# Patient Record
Sex: Female | Born: 1985 | Race: Black or African American | Hispanic: No | Marital: Single | Smoking: Never smoker
Health system: Southern US, Community
[De-identification: ages and names within clinical notes are randomized; demographics above are authoritative.]

## PROBLEM LIST (undated history)

## (undated) DIAGNOSIS — N870 Mild cervical dysplasia: Secondary | ICD-10-CM

## (undated) DIAGNOSIS — R634 Abnormal weight loss: Secondary | ICD-10-CM

## (undated) DIAGNOSIS — R87629 Unspecified abnormal cytological findings in specimens from vagina: Secondary | ICD-10-CM

## (undated) DIAGNOSIS — N762 Acute vulvitis: Secondary | ICD-10-CM

## (undated) DIAGNOSIS — R519 Headache, unspecified: Secondary | ICD-10-CM

## (undated) DIAGNOSIS — D649 Anemia, unspecified: Secondary | ICD-10-CM

## (undated) DIAGNOSIS — Z87440 Personal history of urinary (tract) infections: Secondary | ICD-10-CM

## (undated) DIAGNOSIS — R51 Headache: Secondary | ICD-10-CM

## (undated) DIAGNOSIS — Z8619 Personal history of other infectious and parasitic diseases: Secondary | ICD-10-CM

## (undated) DIAGNOSIS — K219 Gastro-esophageal reflux disease without esophagitis: Secondary | ICD-10-CM

## (undated) HISTORY — DX: Acute vulvitis: N76.2

## (undated) HISTORY — PX: WISDOM TOOTH EXTRACTION: SHX21

## (undated) HISTORY — DX: Personal history of other infectious and parasitic diseases: Z86.19

## (undated) HISTORY — DX: Personal history of urinary (tract) infections: Z87.440

## (undated) HISTORY — DX: Mild cervical dysplasia: N87.0

## (undated) HISTORY — DX: Abnormal weight loss: R63.4

---
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
## 2008-02-16 DIAGNOSIS — N762 Acute vulvitis: Secondary | ICD-10-CM

## 2008-02-16 HISTORY — DX: Acute vulvitis: N76.2

## 2010-09-05 DIAGNOSIS — R634 Abnormal weight loss: Secondary | ICD-10-CM

## 2010-09-05 HISTORY — DX: Abnormal weight loss: R63.4

## 2010-10-25 DIAGNOSIS — Z87440 Personal history of urinary (tract) infections: Secondary | ICD-10-CM

## 2010-10-25 DIAGNOSIS — N870 Mild cervical dysplasia: Secondary | ICD-10-CM

## 2010-10-25 HISTORY — DX: Mild cervical dysplasia: N87.0

## 2010-10-25 HISTORY — DX: Personal history of urinary (tract) infections: Z87.440

## 2011-06-06 ENCOUNTER — Emergency Department (HOSPITAL_COMMUNITY)
Admission: EM | Admit: 2011-06-06 | Discharge: 2011-06-06 | Disposition: A | Discharge status: Home / Self Care | Payer: BC Managed Care – PPO | Attending: Emergency Medicine | Admitting: Emergency Medicine

## 2011-06-06 ENCOUNTER — Encounter (HOSPITAL_COMMUNITY): Payer: Self-pay

## 2011-06-06 ENCOUNTER — Emergency Department (HOSPITAL_COMMUNITY): Payer: BC Managed Care – PPO

## 2011-06-06 DIAGNOSIS — R531 Weakness: Secondary | ICD-10-CM

## 2011-06-06 DIAGNOSIS — R5383 Other fatigue: Secondary | ICD-10-CM | POA: Insufficient documentation

## 2011-06-06 DIAGNOSIS — Z79899 Other long term (current) drug therapy: Secondary | ICD-10-CM | POA: Insufficient documentation

## 2011-06-06 DIAGNOSIS — R209 Unspecified disturbances of skin sensation: Secondary | ICD-10-CM | POA: Insufficient documentation

## 2011-06-06 DIAGNOSIS — R5381 Other malaise: Secondary | ICD-10-CM | POA: Insufficient documentation

## 2011-06-06 MED ORDER — LORAZEPAM 1 MG PO TABS
1.0000 mg | ORAL_TABLET | Freq: Once | ORAL | Status: AC
  Administered 2011-06-06: 1 mg via ORAL
  Filled 2011-06-06: qty 1

## 2011-06-06 NOTE — ED Notes (Signed)
Sudden onset right arm numbness and tingling at 0800hrs

## 2011-06-06 NOTE — ED Notes (Signed)
C/O numbness in the right arm and hand since 0800hrs today, states has a tingling sensation in right arm and hand when the area is touched, able to differentiate between sharp and dull touch equally in both arms, moves all extremities without difficulty, right hand grip weaker than left

## 2011-06-06 NOTE — ED Notes (Signed)
Family at bedside. 

## 2011-06-06 NOTE — ED Notes (Signed)
Patient denies pain and is resting comfortably.  

## 2011-06-06 NOTE — ED Notes (Signed)
Patient is resting comfortably. 

## 2011-06-06 NOTE — ED Notes (Signed)
Patient transported to MRI via stretcher with MRI tech. ?

## 2011-06-06 NOTE — ED Notes (Signed)
Pt given discharge instructions, verb understanding and amb indep to discharge windwo

## 2011-06-06 NOTE — ED Provider Notes (Signed)
History     CSN: [id number]  Arrival date & time 06/06/11  1218   First MD Initiated Contact with Patient 06/06/11 1239      Chief Complaint  Patient presents with  . Numbness    right arm    (Consider location/radiation/quality/duration/timing/severity/associated sxs/prior treatment) HPI Comments: Patient presents to the emergency department with a chief complaint of right-sided weakness and paresthesias.  Patient states her symptoms began at 8 o'clock this morning and denies having symptoms like this before in the past.  Patient denies a family history of multiple sclerosis.  Patient denies change in vision, fatigue, depression, urinary or bowel incontinence, recent trauma to head or neck, lightheadedness, dizziness, disequilibrium, facial twitching, ataxia, headaches, nausea, vomiting.  Patient denies chest pain, shortness of breath, extremity swelling.  Patient states she is on birth control, but denies recent surgery, travel, leg pain, hemoptysis, cough.  The history is provided by the patient.    History reviewed. No pertinent past medical history.  History reviewed. No pertinent past surgical history.  Family History  Problem Relation Age of Onset  . Thyroid disease Mother   . Hypertension Mother     History  Substance Use Topics  . Smoking status: Never Smoker   . Smokeless tobacco: Never Used  . Alcohol Use:      occasional social drinker    OB History    Grav Para Term Preterm Abortions TAB SAB Ect Mult Living                  Review of Systems  Constitutional: Positive for activity change. Negative for fever, chills, diaphoresis and fatigue.  HENT: Negative for ear pain, congestion, facial swelling, neck pain, neck stiffness, sinus pressure and tinnitus.   Eyes: Negative for photophobia, redness and visual disturbance.  Respiratory: Negative for cough, shortness of breath, wheezing and stridor.   Cardiovascular: Negative for chest pain.  Gastrointestinal:  Negative for nausea, vomiting and abdominal pain.  Musculoskeletal: Negative for myalgias and gait problem.  Skin: Negative for rash.  Neurological: Positive for weakness. Negative for dizziness, tremors, syncope, facial asymmetry, speech difficulty, light-headedness, numbness and headaches.       No bowel or bladder incontinence.  Psychiatric/Behavioral: Negative for confusion.  All other systems reviewed and are negative.    Allergies  Review of patient's allergies indicates no known allergies.  Home Medications   Current Outpatient Rx  Name Route Sig Dispense Refill  . ETONOGESTREL-ETHINYL ESTRADIOL 0.12-0.015 MG/24HR VA RING Vaginal Place 1 each vaginally every 28 (twenty-eight) days. Insert vaginally and leave in place for 3 consecutive weeks, then remove for 1 week.    . ADULT MULTIVITAMIN W/MINERALS CH Oral Take 1 tablet by mouth daily.      BP 110/64  Pulse 86  Temp(Src) 98.5 F (36.9 C) (Oral)  Resp 14  Ht 5\' 3"  (1.6 m)  Wt 118 lb (53.524 kg)  BMI 20.90 kg/m2  SpO2 100%  LMP 05/04/2011  Physical Exam  Nursing note and vitals reviewed. Constitutional: She is oriented to person, place, and time. She appears well-developed and well-nourished. No distress.  HENT:  Head: Normocephalic and atraumatic.  Eyes: Conjunctivae and EOM are normal. Pupils are equal, round, and reactive to light. No scleral icterus.  Neck: Normal range of motion and full passive range of motion without pain. Neck supple. No JVD present. Carotid bruit is not present. No rigidity. No Brudzinski's sign noted.  Cardiovascular: Normal rate, regular rhythm, normal heart sounds and intact distal  pulses.   Pulmonary/Chest: Effort normal and breath sounds normal. No respiratory distress. She has no wheezes. She has no rales.  Musculoskeletal: Normal range of motion.       Patient verbalizes subjective right-sided weakness but no objective weakness found on physical exam.   Lymphadenopathy:    She has no  cervical adenopathy.  Neurological: She is alert and oriented to person, place, and time. She has normal strength. No cranial nerve deficit or sensory deficit. She displays a negative Romberg sign. Coordination and gait normal. GCS eye subscore is 4. GCS verbal subscore is 5. GCS motor subscore is 6.       A&O x3.  Able to follow commands. PERRL, EOMs, no vertical or bidirectional nystagmus. Shoulder shrug, facial muscles, tongue protrusion and swallow intact.  Motor strength 5/5 bilaterally including grip strength, triceps, hamstrings and ankle dorsiflexion (strenth testing only equal when pt is distracted)  Normal patellar DTRs.  Light touch intact in all 4 distal limbs and trigeminal CN, pt states feels lighter on right side.  Intact finger to nose, shin to heel and rapid alternating movements. No ataxia or dysequilibrium.   Skin: Skin is warm and dry. No rash noted. She is not diaphoretic.  Psychiatric: She has a normal mood and affect. Her speech is normal and behavior is normal. Judgment and thought content normal. Her mood appears not anxious. Cognition and memory are not impaired. She does not exhibit a depressed mood. She exhibits normal recent memory.    ED Course  Procedures (including critical care time)  Labs Reviewed - No data to display Mr Brain Wo Contrast  06/06/2011  *RADIOLOGY REPORT*  Clinical Data: 26 year old female with right arm numbness.  MRI HEAD WITHOUT CONTRAST  Technique:  Multiplanar, multiecho pulse sequences of the brain and surrounding structures were obtained according to standard protocol without intravenous contrast.  Comparison: None.  Findings: Normal cerebral volume. No restricted diffusion to suggest acute infarction.  No midline shift, mass effect, evidence of mass lesion, ventriculomegaly, extra-axial collection or acute intracranial hemorrhage.  Cervicomedullary junction and pituitary are within normal limits.  Major intracranial vascular flow voids are  preserved.  Wallace Cullens and white matter signal is within normal limits throughout the brain.  Negative visualized cervical spine.  Visualized bone marrow signal is within normal limits.  Pharyngeal tonsil hypertrophy is noted, physiologic in this age group. Visualized orbit soft tissues are within normal limits.  Visualized paranasal sinuses and mastoids are clear.  Negative scalp soft tissues.  IMPRESSION: Normal noncontrast MRI appearance of the brain.  Original Report Authenticated By: Harley Hallmark, M.D.     No diagnosis found.    MDM  Weakness  Pts anxiety treated in ED with ativan. Normal non contrast. MRI. MRI results with pt and family members. Pt advised to follow up with her primary care doctor in regards to today's visit. Pt discussed and seen by Dr. Leo Rod who agrees w plan to dc pt w follow up         Jaci Carrel, PA-C 06/06/11 1524

## 2011-06-07 NOTE — ED Provider Notes (Signed)
Medical screening examination/treatment/procedure(s) were conducted as a shared visit with non-physician practitioner(s) and myself.  I personally evaluated the patient during the encounter Pt a transient right arm numbness/tingling. No headache. No neck pain or radicular. No weakness. Neuro exam normal. Mri neg.   Suzi Roots, MD 06/07/11 [phone]

## 2011-09-29 ENCOUNTER — Other Ambulatory Visit: Payer: Self-pay | Admitting: Obstetrics and Gynecology

## 2011-09-29 MED ORDER — ETONOGESTREL-ETHINYL ESTRADIOL 0.12-0.015 MG/24HR VA RING: VAGINAL_RING | VAGINAL | Status: DC

## 2011-11-09 ENCOUNTER — Telehealth: Payer: Self-pay | Admitting: Obstetrics and Gynecology

## 2011-11-18 NOTE — Telephone Encounter (Signed)
She may have one refill or enough to get to annual exam.  Thank you

## 2011-11-20 ENCOUNTER — Telehealth: Payer: Self-pay

## 2011-11-20 NOTE — Telephone Encounter (Signed)
Spoke to [place] at [facility] to [place] 1 RF of Nuvaring to get pt through til AEX on 12/03/11, per AR. Melody Comas A

## 2011-12-03 ENCOUNTER — Ambulatory Visit (INDEPENDENT_AMBULATORY_CARE_PROVIDER_SITE_OTHER): Payer: BC Managed Care – PPO | Admitting: Obstetrics and Gynecology

## 2011-12-03 ENCOUNTER — Encounter: Payer: Self-pay | Admitting: Obstetrics and Gynecology

## 2011-12-03 VITALS — BP 90/62 | HR 68 | Resp 16 | Ht 63.0 in | Wt 121.0 lb

## 2011-12-03 DIAGNOSIS — Z124 Encounter for screening for malignant neoplasm of cervix: Secondary | ICD-10-CM

## 2011-12-03 DIAGNOSIS — N898 Other specified noninflammatory disorders of vagina: Secondary | ICD-10-CM

## 2011-12-03 DIAGNOSIS — N390 Urinary tract infection, site not specified: Secondary | ICD-10-CM

## 2011-12-03 DIAGNOSIS — Z113 Encounter for screening for infections with a predominantly sexual mode of transmission: Secondary | ICD-10-CM

## 2011-12-03 LAB — POCT WET PREP (WET MOUNT): Clue Cells Wet Prep Whiff POC: POSITIVE

## 2011-12-03 MED ORDER — ETONOGESTREL-ETHINYL ESTRADIOL 0.12-0.015 MG/24HR VA RING: VAGINAL_RING | VAGINAL | Status: DC

## 2011-12-03 MED ORDER — TINIDAZOLE 500 MG PO TABS: 2.0000 g | ORAL_TABLET | Freq: Every day | ORAL | Status: AC

## 2011-12-03 NOTE — Progress Notes (Addendum)
Contraception Nuvaring Last pap 09/27/2010 ASC-US Last Mammo None Last Colonoscopy None Last Dexa Scan None Primary MD None Abuse at Home None  Wants to cont nuvaring.  Had 2wk long spotting the month she left nuvaring in for a month.  Also had postcoital bleeding during that time.  Had nl TSH within last yr per pt at PCP. ?abnl d/c, ?urinary sxs  Filed Vitals:   12/03/11 1500  BP: 90/62  Pulse: 68  Resp: 16   ROS: noncontributory  Physical Examination: General appearance - alert, well appearing, and in no distress Neck - supple, no significant adenopathy Chest - clear to auscultation, no wheezes, rales or rhonchi, symmetric air entry Heart - normal rate and regular rhythm Abdomen - soft, nontender, nondistended, no masses or organomegaly Breasts - breasts appear normal, no suspicious masses, no skin or nipple changes or axillary nodes Pelvic - normal external genitalia, vulva, vagina, cervix, uterus and adnexa Back exam - no CVAT Extremities - no edema, redness or tenderness in the calves or thighs  Results for orders placed in visit on 12/03/11  POCT WET PREP (WET MOUNT)      Component Value Range   Source Wet Prep POC       WBC, Wet Prep HPF POC       Bacteria Wet Prep HPF POC       BACTERIA WET PREP MORPHOLOGY POC       Clue Cells Wet Prep HPF POC Many     CLUE CELLS WET PREP WHIFF POC Positive Whiff     Yeast Wet Prep HPF POC       KOH Wet Prep POC       Trichomonas Wet Prep HPF POC       pH   5.5    A/P If irreg bleeding persists x after dosing change - sched u/s Rx Nuvaring Samples nuvaring Wet prep and UCx 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

## 2011-12-05 LAB — PAP IG, CT-NG, RFX HPV ASCU: GC Probe Amp: NEGATIVE

## 2011-12-06 LAB — HUMAN PAPILLOMAVIRUS, HIGH RISK: HPV DNA High Risk: NOT DETECTED

## 2011-12-20 ENCOUNTER — Encounter: Payer: Self-pay | Admitting: Obstetrics and Gynecology

## 2012-01-11 ENCOUNTER — Encounter: Payer: Self-pay | Admitting: Obstetrics and Gynecology

## 2012-01-11 ENCOUNTER — Ambulatory Visit (INDEPENDENT_AMBULATORY_CARE_PROVIDER_SITE_OTHER): Payer: BC Managed Care – PPO | Admitting: Obstetrics and Gynecology

## 2012-01-11 VITALS — BP 98/62 | Temp 98.3°F | Wt 119.0 lb

## 2012-01-11 DIAGNOSIS — R300 Dysuria: Secondary | ICD-10-CM

## 2012-01-11 DIAGNOSIS — N898 Other specified noninflammatory disorders of vagina: Secondary | ICD-10-CM

## 2012-01-11 DIAGNOSIS — R350 Frequency of micturition: Secondary | ICD-10-CM

## 2012-01-11 LAB — POCT WET PREP (WET MOUNT): pH: 5

## 2012-01-11 LAB — POCT URINALYSIS DIPSTICK: Blood, UA: 2

## 2012-01-11 MED ORDER — NITROFURANTOIN MONOHYD MACRO 100 MG PO CAPS: 100.0000 mg | ORAL_CAPSULE | Freq: Two times a day (BID) | ORAL | Status: AC

## 2012-01-11 MED ORDER — NITROFURANTOIN MONOHYD MACRO 100 MG PO CAPS: 100.0000 mg | ORAL_CAPSULE | Freq: Two times a day (BID) | ORAL | Status: DC

## 2012-01-11 NOTE — Addendum Note (Signed)
Addended by: Osborn Coho on: 01/11/2012 05:54 PM   Modules accepted: Orders

## 2012-01-11 NOTE — Progress Notes (Signed)
C/o feeling like she is getting another UTI.  Reported previously seeing gross blood in her urine when she has had them.  Filed Vitals:   01/11/12 1145  BP: 98/62  Temp: 98.3 F (36.8 C)   ROS: noncontributory  Pelvic exam:  VULVA: normal appearing vulva with no masses, tenderness or lesions,  VAGINA: normal appearing vagina with normal color and discharge, no lesions, white, thick d/c CERVIX: normal appearing cervix without discharge or lesions,  UTERUS: uterus is normal size, shape, consistency and nontender,  ADNEXA: normal adnexa in size, nontender and no masses.  Results for orders placed in visit on 01/11/12  POCT URINALYSIS DIPSTICK      Component Value Range   Color, UA       Clarity, UA       Glucose, UA       Bilirubin, UA       Ketones, UA       Spec Grav, UA       Blood, UA 2     pH, UA       Protein, UA       Urobilinogen, UA       Nitrite, UA       Leukocytes, UA      POCT WET PREP (WET MOUNT)      Component Value Range   Source Wet Prep POC       WBC, Wet Prep HPF POC       Bacteria Wet Prep HPF POC few     BACTERIA WET PREP MORPHOLOGY POC       Clue Cells Wet Prep HPF POC None     CLUE CELLS WET PREP WHIFF POC Negative Whiff     Yeast Wet Prep HPF POC None     KOH Wet Prep POC       Trichomonas Wet Prep HPF POC neg     pH 5.0      A/P Wet prep  UCx Currently declines referral to urology I informed pt that I would strongly rec referral to Uro if she gets another UTI in next two months In the meantime will do macrobid proph after intercourse and rec showers after IC as well. Rx Macrobid for now and as prophylaxis following IC

## 2012-01-14 LAB — URINE CULTURE: Colony Count: >=100000

## 2012-01-18 ENCOUNTER — Telehealth: Payer: Self-pay

## 2012-01-18 NOTE — Telephone Encounter (Signed)
LM for pt to cb re UCX results. Melody Comas A

## 2012-01-21 ENCOUNTER — Encounter: Payer: BC Managed Care – PPO | Admitting: Obstetrics and Gynecology

## 2012-01-23 ENCOUNTER — Telehealth: Payer: Self-pay

## 2012-01-23 NOTE — Telephone Encounter (Signed)
Late entryfor 01/18/2012.  I actually spoke to pt to let her know about her UCX result. It did show an infection of E- Coli. Dr. Su Hilt has already treated this. I was following up to make sure that pt's sx's have improved. The antibiotic that was prescribed should get rid of this particular bacteria. Pt states she is already feeling much better. Melody Comas A

## 2012-05-14 ENCOUNTER — Encounter: Payer: Self-pay | Admitting: Obstetrics and Gynecology

## 2012-05-14 ENCOUNTER — Ambulatory Visit (INDEPENDENT_AMBULATORY_CARE_PROVIDER_SITE_OTHER): Payer: BC Managed Care – PPO | Admitting: Obstetrics and Gynecology

## 2012-05-14 VITALS — BP 104/60 | Resp 16 | Ht 63.0 in | Wt 119.0 lb

## 2012-05-14 DIAGNOSIS — Z113 Encounter for screening for infections with a predominantly sexual mode of transmission: Secondary | ICD-10-CM

## 2012-05-14 DIAGNOSIS — A499 Bacterial infection, unspecified: Secondary | ICD-10-CM

## 2012-05-14 DIAGNOSIS — N949 Unspecified condition associated with female genital organs and menstrual cycle: Secondary | ICD-10-CM

## 2012-05-14 DIAGNOSIS — N760 Acute vaginitis: Secondary | ICD-10-CM

## 2012-05-14 DIAGNOSIS — B9689 Other specified bacterial agents as the cause of diseases classified elsewhere: Secondary | ICD-10-CM

## 2012-05-14 DIAGNOSIS — N898 Other specified noninflammatory disorders of vagina: Secondary | ICD-10-CM

## 2012-05-14 LAB — POCT WET PREP (WET MOUNT): pH: 5.5

## 2012-05-14 MED ORDER — TINIDAZOLE 500 MG PO TABS: 2.0000 g | ORAL_TABLET | Freq: Every day | ORAL | Status: DC

## 2012-05-14 NOTE — Progress Notes (Signed)
Here secondary to c/o odor after menses.  Filed Vitals:   05/14/12 1537  BP: 104/60  Resp: 16   ROS: noncontributory  Pelvic exam:  VULVA: normal appearing vulva with no masses, tenderness or lesions,  VAGINA: normal appearing vagina with normal color and discharge, no lesions, CERVIX: normal appearing cervix without discharge or lesions,  UTERUS: uterus is normal size, shape, consistency and nontender,  ADNEXA: normal adnexa in size, nontender and no masses.  A/P GC/CT with consent Wet prep - BV - tindamax rto for AEX

## 2012-05-15 LAB — GC/CHLAMYDIA PROBE AMP: CT Probe RNA: NEGATIVE

## 2012-05-16 ENCOUNTER — Encounter: Payer: Self-pay | Admitting: Obstetrics and Gynecology

## 2012-06-02 ENCOUNTER — Other Ambulatory Visit: Payer: BC Managed Care – PPO

## 2012-06-02 ENCOUNTER — Other Ambulatory Visit: Payer: Self-pay

## 2012-06-02 DIAGNOSIS — N390 Urinary tract infection, site not specified: Secondary | ICD-10-CM

## 2012-06-02 LAB — POCT URINALYSIS DIPSTICK
Glucose, UA: NEGATIVE
Nitrite, UA: NEGATIVE
Urobilinogen, UA: NEGATIVE

## 2012-06-03 LAB — URINE CULTURE
Colony Count: NO GROWTH
Organism ID, Bacteria: NO GROWTH

## 2012-06-18 ENCOUNTER — Ambulatory Visit: Payer: BC Managed Care – PPO

## 2012-06-18 ENCOUNTER — Encounter: Payer: Self-pay | Admitting: Obstetrics and Gynecology

## 2012-06-18 ENCOUNTER — Ambulatory Visit: Payer: BC Managed Care – PPO | Admitting: Obstetrics and Gynecology

## 2012-06-18 VITALS — BP 100/64 | HR 72 | Wt 124.0 lb

## 2012-06-18 DIAGNOSIS — R109 Unspecified abdominal pain: Secondary | ICD-10-CM

## 2012-06-18 DIAGNOSIS — R102 Pelvic and perineal pain: Secondary | ICD-10-CM

## 2012-06-18 LAB — POCT URINALYSIS DIPSTICK
Bilirubin, UA: NEGATIVE
Nitrite, UA: NEGATIVE
Urobilinogen, UA: NEGATIVE
pH, UA: 7

## 2012-06-18 MED ORDER — IBUPROFEN 800 MG PO TABS: 800.0000 mg | ORAL_TABLET | Freq: Three times a day (TID) | ORAL | Status: DC | PRN

## 2012-06-18 MED ORDER — METRONIDAZOLE 500 MG PO TABS: 500.0000 mg | ORAL_TABLET | Freq: Two times a day (BID) | ORAL | Status: DC

## 2012-06-18 NOTE — Addendum Note (Signed)
Addended by: Lanna Poche on: 06/18/2012 02:06 PM   Modules accepted: Orders

## 2012-06-18 NOTE — Addendum Note (Signed)
Addended by: Stephens Shire on: 06/18/2012 01:59 PM   Modules accepted: Orders

## 2012-06-18 NOTE — Progress Notes (Signed)
Here secondary to lt low back pain and llq pain started Sun.  Had cycle last week. 1st cycle s/p nuvaring d/c'd beg of Jan.  Filed Vitals:   06/18/12 1108  BP: 100/64  Pulse: 72   ROS: noncontributory  Pelvic exam:  VULVA: normal appearing vulva with no masses, tenderness or lesions,  VAGINA: normal appearing vagina with normal color and discharge, no lesions, brown d/c CERVIX: normal appearing cervix without discharge or lesions,  UTERUS: uterus is normal size, shape, consistency and nontender,  ADNEXA: normal adnexa in size, nontender and no masses, mild left adnexal tenderness  Results for orders placed in visit on 06/18/12  POCT URINALYSIS DIPSTICK      Result Value Range   Color, UA yellow     Clarity, UA       Glucose, UA neg     Bilirubin, UA neg     Ketones, UA neg     Spec Grav, UA 1.010     Blood, UA trace     pH, UA 7.0     Protein, UA trace     Urobilinogen, UA negative     Nitrite, UA neg     Leukocytes, UA Trace    POCT URINE PREGNANCY      Result Value Range   Preg Test, Ur Negative     U/S - ut 5.9x 5.1x 4.9cm, nl bil ovaries, lt 2.1cm, rt 3.1cm  A/P Pelvic u/s for LLQ pain today - ?Mittelschmerz - Rx motrin rec referral to urologist secondary to h/o recurrent UTI and c/o blood in urine - urine to cx GC/CT with consent Wet prep - BV - flagyl Last pap 11/2011 - ASCUS with neg HPV

## 2012-06-20 LAB — URINE CULTURE: Organism ID, Bacteria: NO GROWTH

## 2012-06-20 NOTE — Progress Notes (Signed)
Message forwarded to [facility] for referral coordination. Vickie Norris A

## 2012-06-26 ENCOUNTER — Other Ambulatory Visit: Payer: Self-pay | Admitting: Obstetrics and Gynecology

## 2012-06-26 DIAGNOSIS — R109 Unspecified abdominal pain: Secondary | ICD-10-CM

## 2013-02-27 ENCOUNTER — Other Ambulatory Visit: Payer: Self-pay | Admitting: Urology

## 2013-02-27 DIAGNOSIS — N3020 Other chronic cystitis without hematuria: Secondary | ICD-10-CM

## 2013-02-27 DIAGNOSIS — R109 Unspecified abdominal pain: Secondary | ICD-10-CM

## 2013-03-03 ENCOUNTER — Ambulatory Visit
Admission: RE | Admit: 2013-03-03 | Discharge: 2013-03-03 | Disposition: A | Discharge status: Home / Self Care | Payer: BC Managed Care – PPO | Source: Ambulatory Visit | Attending: Urology | Admitting: Urology

## 2013-03-03 DIAGNOSIS — N3020 Other chronic cystitis without hematuria: Secondary | ICD-10-CM

## 2013-03-03 DIAGNOSIS — R109 Unspecified abdominal pain: Secondary | ICD-10-CM

## 2013-03-10 ENCOUNTER — Other Ambulatory Visit: Payer: Self-pay | Admitting: Gastroenterology

## 2013-03-10 DIAGNOSIS — R1032 Left lower quadrant pain: Secondary | ICD-10-CM

## 2013-03-17 ENCOUNTER — Ambulatory Visit
Admission: RE | Admit: 2013-03-17 | Discharge: 2013-03-17 | Disposition: A | Discharge status: Home / Self Care | Payer: BC Managed Care – PPO | Source: Ambulatory Visit | Attending: Gastroenterology | Admitting: Gastroenterology

## 2013-03-17 DIAGNOSIS — R1032 Left lower quadrant pain: Secondary | ICD-10-CM

## 2015-07-02 ENCOUNTER — Encounter (HOSPITAL_COMMUNITY): Payer: Self-pay

## 2015-07-02 DIAGNOSIS — S3992XA Unspecified injury of lower back, initial encounter: Secondary | ICD-10-CM | POA: Diagnosis present

## 2015-07-02 DIAGNOSIS — Y9389 Activity, other specified: Secondary | ICD-10-CM | POA: Insufficient documentation

## 2015-07-02 DIAGNOSIS — S80211A Abrasion, right knee, initial encounter: Secondary | ICD-10-CM | POA: Insufficient documentation

## 2015-07-02 DIAGNOSIS — Z8742 Personal history of other diseases of the female genital tract: Secondary | ICD-10-CM | POA: Insufficient documentation

## 2015-07-02 DIAGNOSIS — Y998 Other external cause status: Secondary | ICD-10-CM | POA: Diagnosis not present

## 2015-07-02 DIAGNOSIS — Z79899 Other long term (current) drug therapy: Secondary | ICD-10-CM | POA: Insufficient documentation

## 2015-07-02 DIAGNOSIS — Y92410 Unspecified street and highway as the place of occurrence of the external cause: Secondary | ICD-10-CM | POA: Diagnosis not present

## 2015-07-02 DIAGNOSIS — T23072A Burn of unspecified degree of left wrist, initial encounter: Secondary | ICD-10-CM | POA: Insufficient documentation

## 2015-07-02 DIAGNOSIS — S39012A Strain of muscle, fascia and tendon of lower back, initial encounter: Secondary | ICD-10-CM | POA: Diagnosis not present

## 2015-07-02 DIAGNOSIS — Z87440 Personal history of urinary (tract) infections: Secondary | ICD-10-CM | POA: Diagnosis not present

## 2015-07-02 DIAGNOSIS — T148 Other injury of unspecified body region: Secondary | ICD-10-CM | POA: Diagnosis not present

## 2015-07-02 DIAGNOSIS — Z8619 Personal history of other infectious and parasitic diseases: Secondary | ICD-10-CM | POA: Diagnosis not present

## 2015-07-02 DIAGNOSIS — X19XXXA Contact with other heat and hot substances, initial encounter: Secondary | ICD-10-CM | POA: Diagnosis not present

## 2015-07-02 DIAGNOSIS — T23071A Burn of unspecified degree of right wrist, initial encounter: Secondary | ICD-10-CM | POA: Diagnosis not present
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
## 2015-07-03 ENCOUNTER — Emergency Department (HOSPITAL_COMMUNITY): Payer: BLUE CROSS/BLUE SHIELD

## 2015-07-03 ENCOUNTER — Emergency Department (HOSPITAL_COMMUNITY)
Admission: EM | Admit: 2015-07-03 | Discharge: 2015-07-03 | Disposition: A | Discharge status: Home / Self Care | Payer: BLUE CROSS/BLUE SHIELD | Attending: Emergency Medicine | Admitting: Emergency Medicine

## 2015-07-03 DIAGNOSIS — T300 Burn of unspecified body region, unspecified degree: Secondary | ICD-10-CM

## 2015-07-03 DIAGNOSIS — T148XXA Other injury of unspecified body region, initial encounter: Secondary | ICD-10-CM

## 2015-07-03 DIAGNOSIS — S39012A Strain of muscle, fascia and tendon of lower back, initial encounter: Secondary | ICD-10-CM

## 2015-07-03 MED ORDER — OXYCODONE-ACETAMINOPHEN 5-325 MG PO TABS
1.0000 | ORAL_TABLET | Freq: Once | ORAL | Status: AC
Start: 2015-07-03 — End: 2015-07-03
  Administered 2015-07-03: 1 via ORAL
  Filled 2015-07-03: qty 1

## 2015-07-03 MED ORDER — TRAMADOL HCL 50 MG PO TABS: 50.0000 mg | ORAL_TABLET | Freq: Four times a day (QID) | ORAL | Status: DC | PRN

## 2015-07-03 MED ORDER — IBUPROFEN 800 MG PO TABS: 800.0000 mg | ORAL_TABLET | Freq: Three times a day (TID) | ORAL | Status: DC

## 2015-07-03 MED ORDER — CYCLOBENZAPRINE HCL 10 MG PO TABS: 10.0000 mg | ORAL_TABLET | Freq: Three times a day (TID) | ORAL | Status: DC | PRN

## 2015-07-03 NOTE — ED Notes (Signed)
Pt returned from xray
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
## 2015-07-03 NOTE — ED Notes (Signed)
Pt taken to xray 

## 2015-07-19 ENCOUNTER — Ambulatory Visit: Payer: BLUE CROSS/BLUE SHIELD | Attending: Family Medicine | Admitting: Physical Therapy

## 2015-07-19 DIAGNOSIS — M545 Low back pain, unspecified: Secondary | ICD-10-CM

## 2015-07-19 DIAGNOSIS — M2560 Stiffness of unspecified joint, not elsewhere classified: Secondary | ICD-10-CM | POA: Diagnosis present

## 2015-07-19 DIAGNOSIS — M546 Pain in thoracic spine: Secondary | ICD-10-CM | POA: Insufficient documentation

## 2015-07-19 DIAGNOSIS — M25511 Pain in right shoulder: Secondary | ICD-10-CM | POA: Insufficient documentation

## 2015-07-19 DIAGNOSIS — M25562 Pain in left knee: Secondary | ICD-10-CM | POA: Diagnosis present
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
## 2015-07-19 NOTE — Patient Instructions (Signed)
Active rest phase:  Slow walks several times a day.  Don't sit too long, watch your posture when you do sit.    Heat or cold for pain control.  Change your position often.     Copyright  VHI. All rights reserved.  Standing Arch (Extension)   Place hands in small of back. Using hands as fulcrum, arch backward. Try to keep knees straight. Great exercise if sitting makes pain worse. Use to break up long periods of sitting. Repeat ___3_ times. Do __3__ sessions per day.  http://gt2.exer.us/247   Copyright  VHI. All rights reserved.  Straight Leg Calf Stretch (Gastroc)   Put palms against wall, one leg forward and bent. With other leg back straight and heel flat on floor, lean into wall. Hold _5-10___ seconds. Change legs and repeat. Repeat __3__ times. Do __3__ sessions per day.  http://gt2.exer.us/419   Copyright  VHI. All rights reserved.   Lavinia SharpsStacy Symantha Steeber PT [facility] [address], [address] [place], [place] [address] Phone # [phone] Fax [phone]

## 2015-07-21 ENCOUNTER — Encounter: Payer: Self-pay | Admitting: Physical Therapy

## 2015-07-21 ENCOUNTER — Ambulatory Visit: Payer: BLUE CROSS/BLUE SHIELD | Admitting: Physical Therapy

## 2015-07-21 DIAGNOSIS — M546 Pain in thoracic spine: Secondary | ICD-10-CM

## 2015-07-21 DIAGNOSIS — M545 Low back pain, unspecified: Secondary | ICD-10-CM

## 2015-07-21 DIAGNOSIS — M25511 Pain in right shoulder: Secondary | ICD-10-CM

## 2015-07-21 DIAGNOSIS — M2560 Stiffness of unspecified joint, not elsewhere classified: Secondary | ICD-10-CM

## 2015-07-21 DIAGNOSIS — M25562 Pain in left knee: Secondary | ICD-10-CM

## 2015-07-21 NOTE — Patient Instructions (Signed)
  Cervico-Thoracic: Extension / Rotation (Sitting)   Reach across body with left arm and grasp back of chair. Gently look over right side shoulder. Hold __20__ seconds. Relax. Repeat ___3_ times per set. Do _1___ sets per session. Do __3__ sessions per day.  Copyright  VHI. All rights reserved.     Lumbar Rotation: Caudal - Bilateral (Supine)   Feet and knees together, arms outstretched, rotate knees left, turning head in opposite direction, until stretch is felt. Hold _20___ seconds. Relax. Repeat __3__ times per set. Do _1___ sets per session. Do _3___ sessions per day.  http://orth.exer.us/1020   Copyright  VHI. All rights reserved.  Chair Knee Flexion   Keeping feet on floor, slide foot of operated leg back, bending knee. Hold _3-5___ seconds. Repeat _10___ times. Do _2-3___ sessions a day.  Heel Slide   Bend left knee and pull heel toward buttocks. Use strap around foot and pull strap with arms to assist knee to bend further. Hold 10 secs.  Repeat 10____ times. Do _3___ sessions per day.  Knee Wall Slide   Slowly "walk" or slide feet on wall toward floor until stretch is felt in knees. Repeat __10__ times per set. Do _2___ sets per session. Do _2-3___ sessions per day.
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
## 2015-07-26 ENCOUNTER — Encounter: Payer: Self-pay | Admitting: Physical Therapy

## 2015-07-26 ENCOUNTER — Ambulatory Visit: Payer: BLUE CROSS/BLUE SHIELD | Admitting: Physical Therapy

## 2015-07-26 DIAGNOSIS — M546 Pain in thoracic spine: Secondary | ICD-10-CM | POA: Diagnosis not present

## 2015-07-26 DIAGNOSIS — M545 Low back pain, unspecified: Secondary | ICD-10-CM

## 2015-07-26 DIAGNOSIS — M25511 Pain in right shoulder: Secondary | ICD-10-CM

## 2015-07-26 DIAGNOSIS — M2560 Stiffness of unspecified joint, not elsewhere classified: Secondary | ICD-10-CM

## 2015-07-26 DIAGNOSIS — M25562 Pain in left knee: Secondary | ICD-10-CM
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
## 2015-07-26 NOTE — Patient Instructions (Addendum)
  Copyright  VHI. All rights reserved.   Cervico-Thoracic: Extension / Rotation (Sitting)   Reach across body with left arm and grasp back of chair. Gently look over right side shoulder. Hold __20__ seconds. Relax. Repeat ___3_ times per set. Do _1___ sets per session. Do __3__ sessions per day.  Copyright  VHI. All rights reserved.     Lumbar Rotation: Caudal - Bilateral (Supine)   Feet and knees together, arms outstretched, rotate knees left, turning head in opposite direction, until stretch is felt. Hold _20___ seconds. Relax. Repeat __3__ times per set. Do _1___ sets per session. Do _3___ sessions per day.  http://[email or web address].exer.us/1020   Copyright  VHI. All rights reserved.

## 2015-07-28 ENCOUNTER — Ambulatory Visit: Payer: BLUE CROSS/BLUE SHIELD | Admitting: Physical Therapy

## 2015-07-28 DIAGNOSIS — M545 Low back pain, unspecified: Secondary | ICD-10-CM

## 2015-07-28 DIAGNOSIS — M546 Pain in thoracic spine: Secondary | ICD-10-CM

## 2015-07-28 DIAGNOSIS — M25562 Pain in left knee: Secondary | ICD-10-CM

## 2015-07-28 NOTE — Therapy (Signed)
[facility] [address] [place], [place], [address] Phone: [phone]   Fax:  [phone]  Physical Therapy Treatment  Patient Details  Name: Vickie Norris MRN: [id number] Date of Birth: 07-01-1985 Referring Provider: Dr. Kateri Plummer  Encounter Date: 07/28/2015      PT End of Session - 07/28/15 1531    Visit Number 4   Number of Visits 16   Date for PT Re-Evaluation 09/13/15   Authorization Type BCBS   PT Start Time 1455   PT Stop Time 1540   PT Time Calculation (min) 45 min   Activity Tolerance Patient limited by pain      Past Medical History  Diagnosis Date  . H/O varicella   . H/O bladder infections   . Vulvitis 02/16/2008  . Weight loss 09/2010  . CIN I (cervical intraepithelial neoplasia I) 10/25/10  . Hx: UTI (urinary tract infection) 10/25/10    Past Surgical History  Procedure Laterality Date  . Wisdom tooth extraction      There were no vitals filed for this visit.  Visit Diagnosis:  Midline thoracic back pain  Bilateral low back pain without sciatica  Knee pain, acute, left      Subjective Assessment - 07/28/15 1455    Subjective Arrives 10 min late.  States she had increased pain after "working out" last time.  That night was rought.  Today in low back and left hip.  I'm still dragging my leg.  Muscle spasms left thigh.     Currently in Pain? Yes   Pain Score 7    Pain Location Back   Pain Orientation Right;Left   Pain Type Acute pain                         [facility] Adult PT Treatment/Exercise - 07/28/15 0001    Lumbar Exercises: Supine   Other Supine Lumbar Exercises LEs on ball hip and knee flexion 15x   Other Supine Lumbar Exercises Supine with LEs on ball lumbar rotation   Moist Heat Therapy   Number Minutes Moist Heat 15 Minutes   Moist Heat Location Lumbar Spine   Electrical Stimulation   Electrical Stimulation Location lumbar and left ant thigh    Electrical Stimulation Action pre-mod   Electrical Stimulation Parameters 7 ma   Electrical Stimulation Goals Pain   Manual Therapy   Soft tissue mobilization Graston technique G 4 fanning and sweeping B lumbar paraspinals, gluteals, left quads                  PT Short Term Goals - 07/28/15 1536    PT SHORT TERM GOAL #1   Title The patient will express good understanding on self care, posture, body mechanics to promote healing of tissues  08/16/15   Time 4   Period Weeks   Status On-going   PT SHORT TERM GOAL #2   Title 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   Time 4   Period Weeks   Status On-going   PT SHORT TERM GOAL #4   Title Lumbar flexion improved to 60 degrees, extension to 25 degrees needed for greater  ease with transitional movements and future return to dancing, hiking   Time 4   Period Weeks   Status On-going           PT Long Term Goals - 07/28/15 1536    PT LONG TERM GOAL #1   Title The patient will be independent in safe self progression of HEP for further improvements and return to full function  09/13/15   Time 8   Period Weeks   Status On-going   PT LONG TERM GOAL #2   Title The patient will be able to stand for longer periods of time to 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, 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   Time 8   Period Weeks   Status On-going   PT LONG TERM GOAL #3   Title The patient will be able to walk community distances for shopping and exercise for > 30 min with minimal exacerbation of pain   Time 8   Period Weeks   Status On-going   PT LONG TERM GOAL #4   Title The patient will have UE, LE and trunk strength of 4+/5 to 5-/5 needed for pushing/pulling desks and chairs at school    Time 8    Period Weeks   Status On-going   PT LONG TERM GOAL #5   Title FOTO functional outcome score improved from 62% limitation to 36% indicating improved function with less pain   Time 8   Period Weeks   Status On-going               Plan - 07/28/15 1532    Clinical Impression Statement The patient complains of continued moderate to severe low back pain and left anterior thigh pain to knee.  She feels that "overdoing it" flares her symptoms.  Treatment focus on pain management with gentle mobility exercises, soft tissue work and modalities.  Discussed continuation of "active rest" phase of recovery (walking, frequent change of position, gentle ex).  Therapist closely monitoring response and modifying treatment.     PT Next Visit Plan assess response to soft tissue, gentle mobility, modalties,  ex reactivation as tolerated        Problem List There are no active problems to display for this patient.   Vivien PrestoSimpson, Stacy C 07/28/2015, 3:39 PM  [facility] [address], [address] [place], [place], [address] Phone: [phone]   Fax:  [phone]  Name: Vickie Norris MRN: [id number] Date of Birth: [birth year]    Lavinia SharpsStacy Simpson, PT 07/28/2015 3:40 PM Phone: [phone] Fax: [phone]

## 2015-08-02 ENCOUNTER — Ambulatory Visit: Payer: BLUE CROSS/BLUE SHIELD | Admitting: Physical Therapy

## 2015-08-02 DIAGNOSIS — M546 Pain in thoracic spine: Secondary | ICD-10-CM | POA: Diagnosis not present

## 2015-08-02 DIAGNOSIS — M545 Low back pain, unspecified: Secondary | ICD-10-CM

## 2015-08-02 DIAGNOSIS — M2560 Stiffness of unspecified joint, not elsewhere classified: Secondary | ICD-10-CM

## 2015-08-02 DIAGNOSIS — M25511 Pain in right shoulder: Secondary | ICD-10-CM

## 2015-08-02 DIAGNOSIS — M25562 Pain in left knee: Secondary | ICD-10-CM

## 2015-08-02 NOTE — Therapy (Signed)
[facility] [address], [address] [place], [place], [address] Phone: [phone]   Fax:  [phone]  Physical Therapy Treatment  Patient Details  Name: Vickie Norris MRN: [id number] Date of Birth: 07-May-1986 Referring Provider: Dr. Kateri PlummerMorrow  Encounter Date: 08/02/2015      PT End of Session - 08/02/15 1518    Visit Number 5   Number of Visits 16   Date for PT Re-Evaluation 09/13/15   Authorization Type BCBS   PT Start Time 1449   PT Stop Time 1528   PT Time Calculation (min) 39 min   Activity Tolerance Patient limited by pain      Past Medical History  Diagnosis Date  . H/O varicella   . H/O bladder infections   . Vulvitis 02/16/2008  . Weight loss 09/2010  . CIN I (cervical intraepithelial neoplasia I) 10/25/10  . Hx: UTI (urinary tract infection) 10/25/10    Past Surgical History  Procedure Laterality Date  . Wisdom tooth extraction      There were no vitals filed for this visit.  Visit Diagnosis:  Midline thoracic back pain  Bilateral low back pain without sciatica  Knee pain, acute, left  Right shoulder pain  Joint stiffness      Subjective Assessment - 08/02/15 1455    Subjective States she had terrible pain last Thursday night especially with sitting.  I almost went to Urgent Care but I toughed.   Feeling better today.  Sore in HS and front thigh.  I was thinking about scheduliing another doctor's appt.  I got a massage on Saturday.      Currently in Pain? Yes   Pain Score 4    Pain Location Back   Pain Orientation Right;Left                         [facility] Adult PT Treatment/Exercise - 08/02/15 0001    Lumbar Exercises: Stretches   Single Knee to Chest Stretch 20 seconds;2 reps  right SKTC with left LE long   Quad Stretch 2 reps;20 seconds   Quad Stretch Limitations dangle over the side of the bed   Lumbar Exercises: Aerobic   UBE (Upper Arm Bike) 6 min with pillow in  chair   Lumbar Exercises: Supine   Other Supine Lumbar Exercises LEs on ball hip and knee flexion 15x   Other Supine Lumbar Exercises Supine with LEs on ball lumbar rotation   Lumbar Exercises: Prone   Other Prone Lumbar Exercises HS curls 8x R/L   Lumbar Exercises: Quadruped   [place] 5 reps   Knee/Hip Exercises: Standing   Other Standing Knee Exercises doorway psoas stretch with UE movements on left 5x 3   Moist Heat Therapy   Number Minutes Moist Heat 11 Minutes   Moist Heat Location Lumbar Spine  supine                  PT Short Term Goals - 08/02/15 1713    PT SHORT TERM GOAL #1   Title The patient will express good understanding on self care, posture, body mechanics to promote healing of tissues  08/16/15   Time 4   Period Weeks   Status On-going   PT SHORT TERM GOAL #2   Title 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   Time 4   Period Weeks   Status On-going   PT SHORT TERM GOAL #4   Title Lumbar flexion improved to 60 degrees, extension to 25 degrees needed for greater ease with transitional movements and future return to dancing, hiking   Time 4   Period Weeks   Status On-going           PT Long Term Goals - 08/02/15 1713    PT LONG TERM GOAL #1   Title The patient will be independent in safe self progression of HEP for further improvements and return to full function  09/13/15   Period Weeks   Status On-going   PT LONG TERM GOAL #2   Title The patient will be able to stand for longer periods of time to 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, 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   Time 8   Period Weeks   Status On-going   PT LONG TERM GOAL #3   Title The patient will be able to walk community distances for  shopping and exercise for > 30 min with minimal exacerbation of pain   Time 8   Period Weeks   PT LONG TERM GOAL #4   Title The patient will have UE, LE and trunk strength of 4+/5 to 5-/5 needed for pushing/pulling desks and chairs at school    Period Weeks   Status On-going   PT LONG TERM GOAL #5   Title FOTO functional outcome score improved from 62% limitation to 36% indicating improved function with less pain   Time 8   Period Weeks   Status On-going               Plan - 08/02/15 1708    Clinical Impression Statement The patient continues to be painful even with the gentlest of mobility exercises.   Fear of physical activity may be a factor.   Decreased psoas muscle length noted.   Therapist closely monitoring response and modifying as needed.     PT Next Visit Plan assess response to soft tissue, gentle mobility, modalties,  ex reactivation as tolerated        Problem List There are no active problems to display for this patient.   Vivien Presto 08/02/2015, 5:16 PM  [facility] [address] [place], [place], [address] Phone: [phone]   Fax:  [phone]  Name: Vickie Norris MRN: [id number] Date of Birth: [birth year]    Lavinia Sharps, PT 08/02/2015 5:17 PM Phone: [phone] Fax: [phone]

## 2015-08-04 ENCOUNTER — Ambulatory Visit: Payer: BLUE CROSS/BLUE SHIELD | Admitting: Physical Therapy

## 2015-08-04 DIAGNOSIS — M546 Pain in thoracic spine: Secondary | ICD-10-CM

## 2015-08-04 DIAGNOSIS — M545 Low back pain, unspecified: Secondary | ICD-10-CM

## 2015-08-04 DIAGNOSIS — M2560 Stiffness of unspecified joint, not elsewhere classified: Secondary | ICD-10-CM

## 2015-08-04 DIAGNOSIS — M25511 Pain in right shoulder: Secondary | ICD-10-CM

## 2015-08-04 DIAGNOSIS — M25562 Pain in left knee: Secondary | ICD-10-CM

## 2015-08-04 NOTE — Therapy (Signed)
[facility] [address], [address] [place], [place], [address] Phone: [phone]   Fax:  [phone]  Physical Therapy Treatment  Patient Details  Name: Vickie Norris MRN: [id number] Date of Birth: April 18, 1986 Referring Provider: Dr. Kateri PlummerMorrow  Encounter Date: 08/04/2015      PT End of Session - 08/04/15 1638    Visit Number 6   Number of Visits 16   Date for PT Re-Evaluation 09/13/15   Authorization Type BCBS   PT Start Time 1610   PT Stop Time 1653   PT Time Calculation (min) 43 min   Activity Tolerance Patient limited by pain      Past Medical History  Diagnosis Date  . H/O varicella   . H/O bladder infections   . Vulvitis 02/16/2008  . Weight loss 09/2010  . CIN I (cervical intraepithelial neoplasia I) 10/25/10  . Hx: UTI (urinary tract infection) 10/25/10    Past Surgical History  Procedure Laterality Date  . Wisdom tooth extraction      There were no vitals filed for this visit.  Visit Diagnosis:  Midline thoracic back pain  Bilateral low back pain without sciatica  Knee pain, acute, left  Right shoulder pain  Joint stiffness      Subjective Assessment - 08/04/15 1613    Subjective Doing better today.  Sat more today during testing at work.     Currently in Pain? Yes   Pain Score 2    Pain Location Back   Pain Orientation Right;Left                         [facility] Adult PT Treatment/Exercise - 08/04/15 0001    Lumbar Exercises: Stretches   Single Knee to Chest Stretch 20 seconds;2 reps  right SKTC with left LE long   Quad Stretch 2 reps;20 seconds   Quad Stretch Limitations dangle over the side of the bed   Lumbar Exercises: Aerobic   UBE (Upper Arm Bike) 3 min with pillow and lumbar support   Lumbar Exercises: Seated   Long Arc Quad on Chair Right;Left;1 set;10 reps  neural flossing with head flex/ext   Lumbar Exercises: Supine   Other Supine Lumbar Exercises LEs on ball  hip and knee flexion 15x   Other Supine Lumbar Exercises Supine with LEs on ball lumbar rotation   Lumbar Exercises: Prone   Other Prone Lumbar Exercises HS curls 8x R/L   Other Prone Lumbar Exercises prone press up small range 5x   Moist Heat Therapy   Number Minutes Moist Heat 15 Minutes   Moist Heat Location Lumbar Spine;Hip   Electrical Stimulation   Electrical Stimulation Location lumbar and left post thigh   Electrical Stimulation Action pre-mod   Electrical Stimulation Parameters 7 ma   Electrical Stimulation Goals Pain                  PT Short Term Goals - 08/04/15 1641    PT SHORT TERM GOAL #1   Title The patient will express good understanding on self care, posture, body mechanics to promote healing of tissues  08/16/15   Status Achieved   PT SHORT TERM GOAL #2   Title 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   Time 4   Period Weeks   Status On-going   PT SHORT TERM GOAL #4   Title Lumbar flexion improved to 60 degrees, extension to 25 degrees needed for greater ease with transitional movements and future return to dancing, hiking   Time 4   Period Weeks   Status On-going           PT Long Term Goals - 08/04/15 1642    PT LONG TERM GOAL #1   Title The patient will be independent in safe self progression of HEP for further improvements and return to full function  09/13/15   Time 8   Period Weeks   Status On-going   PT LONG TERM GOAL #2   Title The patient will be able to stand for longer periods of time to 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, 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   Time 8   Period Weeks   Status On-going   PT LONG TERM GOAL #3   Title The patient will be able to walk community distances for  shopping and exercise for > 30 min with minimal exacerbation of pain   Time 8   Period Weeks   Status On-going   PT LONG TERM GOAL #4   Title The patient will have UE, LE and trunk strength of 4+/5 to 5-/5 needed for pushing/pulling desks and chairs at school    Time 8   Period Weeks   Status On-going   PT LONG TERM GOAL #5   Title FOTO functional outcome score improved from 62% limitation to 36% indicating improved function with less pain   Time 8   Period Weeks   Status On-going               Plan - 08/04/15 1638    Clinical Impression Statement The patient continues to be painful with even low level mobility exercises.  Left psoas and HS with decreased muscle lengths.  She is able to move slightly further today with fewer pain behaviors.  Decreased pain with e-stim/heat .  Therapist closely monitoring response and modifying as needed to decrease pain.   Slower progress toward toward goals secondary to pain acuity.     PT Next Visit Plan gentle mobility and neural mobility as tolerated;  modalities for pain control;  recheck lumbar AROM and progress toward STGs.        Problem List There are no active problems to display for this patient.   Vivien Presto 08/04/2015, 4:44 PM  [facility] [address] [place], [place], [address] Phone: [phone]   Fax:  [phone]  Name: Vickie Norris MRN: [id number] Date of Birth: 09-13-1985    Vickie Norris, PT 08/04/2015 4:44 PM Phone: [phone] Fax: [phone]

## 2015-08-09 ENCOUNTER — Ambulatory Visit: Payer: BLUE CROSS/BLUE SHIELD | Attending: Family Medicine | Admitting: Physical Therapy

## 2015-08-09 DIAGNOSIS — M546 Pain in thoracic spine: Secondary | ICD-10-CM | POA: Diagnosis present

## 2015-08-09 DIAGNOSIS — M2560 Stiffness of unspecified joint, not elsewhere classified: Secondary | ICD-10-CM | POA: Diagnosis present

## 2015-08-09 DIAGNOSIS — M25511 Pain in right shoulder: Secondary | ICD-10-CM

## 2015-08-09 DIAGNOSIS — M545 Low back pain, unspecified: Secondary | ICD-10-CM

## 2015-08-09 DIAGNOSIS — M25662 Stiffness of left knee, not elsewhere classified: Secondary | ICD-10-CM

## 2015-08-09 DIAGNOSIS — M25562 Pain in left knee: Secondary | ICD-10-CM | POA: Diagnosis present
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
## 2015-08-11 ENCOUNTER — Encounter: Payer: Self-pay | Admitting: Physical Therapy

## 2015-08-11 ENCOUNTER — Ambulatory Visit: Payer: BLUE CROSS/BLUE SHIELD | Admitting: Physical Therapy

## 2015-08-11 DIAGNOSIS — M25662 Stiffness of left knee, not elsewhere classified: Secondary | ICD-10-CM

## 2015-08-11 DIAGNOSIS — M2560 Stiffness of unspecified joint, not elsewhere classified: Secondary | ICD-10-CM

## 2015-08-11 DIAGNOSIS — M545 Low back pain, unspecified: Secondary | ICD-10-CM

## 2015-08-11 DIAGNOSIS — M546 Pain in thoracic spine: Secondary | ICD-10-CM | POA: Diagnosis not present

## 2015-08-11 DIAGNOSIS — M25562 Pain in left knee: Secondary | ICD-10-CM
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
## 2015-08-16 ENCOUNTER — Encounter: Payer: BC Managed Care – PPO | Admitting: Physical Therapy

## 2015-08-18 ENCOUNTER — Ambulatory Visit: Payer: BLUE CROSS/BLUE SHIELD | Admitting: Physical Therapy

## 2015-08-23 ENCOUNTER — Encounter: Payer: Self-pay | Admitting: Physical Therapy

## 2015-08-23 ENCOUNTER — Ambulatory Visit: Payer: BLUE CROSS/BLUE SHIELD | Admitting: Physical Therapy

## 2015-08-23 DIAGNOSIS — M25562 Pain in left knee: Secondary | ICD-10-CM

## 2015-08-23 DIAGNOSIS — M546 Pain in thoracic spine: Secondary | ICD-10-CM

## 2015-08-23 DIAGNOSIS — M25511 Pain in right shoulder: Secondary | ICD-10-CM

## 2015-08-23 DIAGNOSIS — M25662 Stiffness of left knee, not elsewhere classified: Secondary | ICD-10-CM
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
## 2015-08-25 ENCOUNTER — Encounter: Payer: Self-pay | Admitting: Physical Therapy

## 2015-08-25 ENCOUNTER — Ambulatory Visit: Payer: BLUE CROSS/BLUE SHIELD | Admitting: Physical Therapy

## 2015-08-25 DIAGNOSIS — M546 Pain in thoracic spine: Secondary | ICD-10-CM

## 2015-08-25 DIAGNOSIS — M25662 Stiffness of left knee, not elsewhere classified: Secondary | ICD-10-CM

## 2015-08-25 DIAGNOSIS — M25562 Pain in left knee: Secondary | ICD-10-CM

## 2015-08-25 DIAGNOSIS — M25511 Pain in right shoulder: Secondary | ICD-10-CM

## 2015-08-25 NOTE — Patient Instructions (Signed)
.  PNF Strengthening: Resisted   Standing with resistive band around each hand, bring right arm up and away, thumb back. Do both sides. Repeat 10____ times per set. Do _1-3___ sets per session. Do ___1_ sessions per day.  http://orth.exer.us/918   Copyright  VHI. All rights reserved.  Strengthening: Chest Pull - Resisted   With resistive band looped around each hand, and arms straight out in front, stretch band across chest. Repeat __10__ times per set. Do 1-3____ sets per session. Do _1___ sessions per day.  http://orth.exer.us/926   Copyright  VHI. All rights reserved.   ALSO PULL BAND APART AND THEN RAISE OVERHEAD FROM WAIST. SAME REPS.  [facility] [address], [address] [place], [place] [address] Phone # [phone] Fax [phone]
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
## 2015-08-30 ENCOUNTER — Ambulatory Visit: Payer: BLUE CROSS/BLUE SHIELD | Admitting: Physical Therapy

## 2015-08-30 ENCOUNTER — Encounter: Payer: Self-pay | Admitting: Physical Therapy

## 2015-08-30 DIAGNOSIS — M25511 Pain in right shoulder: Secondary | ICD-10-CM

## 2015-08-30 DIAGNOSIS — M546 Pain in thoracic spine: Secondary | ICD-10-CM | POA: Diagnosis not present

## 2015-08-30 DIAGNOSIS — M25562 Pain in left knee: Secondary | ICD-10-CM

## 2015-08-30 DIAGNOSIS — M25662 Stiffness of left knee, not elsewhere classified: Secondary | ICD-10-CM

## 2015-08-30 NOTE — Therapy (Signed)
[facility] [address], [address] [place], [place], [address] Phone: [phone]   Fax:  [phone]  Physical Therapy Treatment  Patient Details  Name: Rudi CocoKnitalya Levitan MRN: [id number] Date of Birth: 10/26/1985 Referring Provider: Dr Kateri PlummerMorrow  Encounter Date: 08/30/2015      PT End of Session - 08/30/15 1618    Visit Number 11   Number of Visits 16   Date for PT Re-Evaluation 10/05/15   Authorization Type BCBS   PT Start Time 1532   PT Stop Time 1631   PT Time Calculation (min) 59 min   Activity Tolerance Patient limited by pain   Behavior During Therapy [facility] for tasks assessed/performed      Past Medical History  Diagnosis Date  . H/O varicella   . H/O bladder infections   . Vulvitis 02/16/2008  . Weight loss 09/2010  . CIN I (cervical intraepithelial neoplasia I) 10/25/10  . Hx: UTI (urinary tract infection) 10/25/10    Past Surgical History  Procedure Laterality Date  . Wisdom tooth extraction      There were no vitals filed for this visit.      Subjective Assessment - 08/30/15 1550    Subjective Pt                          [facility] Adult PT Treatment/Exercise - 08/30/15 0001    Exercises   Exercises Lumbar;Knee/Hip   Lumbar Exercises: Stretches   Single Knee to Chest Stretch 20 seconds;2 reps  each leg   Lower Trunk Rotation 3 reps;20 seconds   Prone on Elbows Stretch 1 rep  Prayer stretch 2 min hold x 1   Lumbar Exercises: Aerobic   UBE (Upper Arm Bike) 6 (3/3) with pillow and lumbar support   Lumbar Exercises: Supine   Other Supine Lumbar Exercises PROM: LEs on ball hip and knee flexion 20x   Moist Heat Therapy   Number Minutes Moist Heat 15 Minutes   Moist Heat Location Lumbar Spine   Electrical Stimulation   Electrical Stimulation Location lumbar  and thoracic   Electrical Stimulation Action Premode   Electrical Stimulation Parameters 11am   Electrical Stimulation Goals Pain    Manual Therapy   Manual Therapy --  low back paraspinals and QL   Soft tissue mobilization to lumbar/thoracic paraspinals and Lt quadratuslumborum in Rt sidelying                  PT Short Term Goals - 08/30/15 1622    PT SHORT TERM GOAL #1   Title The patient will express good understanding on self care, posture, body mechanics to promote healing of tissues  08/16/15   Time 4   Period Weeks   Status Achieved   PT SHORT TERM GOAL #2   Title 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   Time 4   Period Weeks   Status On-going   PT SHORT TERM GOAL #4   Title Lumbar flexion improved to 60 degrees, extension to 25 degrees needed for greater ease with transitional movements and future return to dancing, hiking   Time 4   Period Weeks  Status On-going           PT Long Term Goals - 08/11/15 1506    PT LONG TERM GOAL #1   Title The patient will be independent in safe self progression of HEP for further improvements and return to full function  09/13/15   Time 8   Period Weeks   Status On-going   PT LONG TERM GOAL #2   Title The patient will be able to stand for longer periods of time to 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, 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  10% as of April 6th   Time 8   Period Weeks   Status On-going   PT LONG TERM GOAL #3   Title The patient will be able to walk community distances for shopping and exercise for > 30 min with minimal exacerbation of pain   Time 8   Period Weeks   Status On-going   PT LONG TERM GOAL #4   Title The patient will have UE, LE and trunk strength of 4+/5 to 5-/5 needed for pushing/pulling desks and chairs at school    Time 8   Period Weeks   Status On-going   PT LONG TERM GOAL #5    Title FOTO functional outcome score improved from 62% limitation to 36% indicating improved function with less pain   Time 8   Period Weeks   Status On-going               Plan - 08/30/15 1619    Clinical Impression Statement Pt feels tired, trunk rotation still limited in ROM and with discomfort at available range of motion. Pt will continue to benefit from skilled PT to advance flexibility, strength endurance and release muscle tension   Rehab Potential Good   PT Frequency 2x / week   PT Duration 8 weeks   PT Treatment/Interventions ADLs/Self Care Home Management;Cryotherapy;Electrical Stimulation;Moist Heat;Therapeutic activities;Ultrasound;Traction;Patient/family education;Taping;Dry needling   PT Next Visit Plan conitnue with trunk rotation, stretching, add prayer stretch to HEP, modalities to release pain and muscle tension   Consulted and Agree with Plan of Care Patient      Patient will benefit from skilled therapeutic intervention in order to improve the following deficits and impairments:  Decreased activity tolerance, Decreased strength, Decreased range of motion, Difficulty walking, Increased muscle spasms, Pain, Impaired flexibility  Visit Diagnosis: Pain in left knee  Pain in right shoulder  Stiffness of left knee, not elsewhere classified  Pain in thoracic spine     Problem List There are no active problems to display for this patient.   NAUMANN-HOUEGNIFIO,Rashelle Ireland PTA 08/30/2015, 4:25 PM  [facility] [address], [address] [place], [place], [address] Phone: [phone]   Fax:  [phone]  Name: Dianara Smullen MRN: [id number] Date of Birth: 1985/09/04

## 2015-09-06 ENCOUNTER — Ambulatory Visit: Payer: BLUE CROSS/BLUE SHIELD | Attending: Family Medicine | Admitting: Physical Therapy

## 2015-09-06 DIAGNOSIS — M25562 Pain in left knee: Secondary | ICD-10-CM

## 2015-09-06 DIAGNOSIS — M25511 Pain in right shoulder: Secondary | ICD-10-CM

## 2015-09-06 DIAGNOSIS — M545 Low back pain: Secondary | ICD-10-CM | POA: Insufficient documentation

## 2015-09-06 DIAGNOSIS — M25662 Stiffness of left knee, not elsewhere classified: Secondary | ICD-10-CM | POA: Diagnosis present

## 2015-09-06 DIAGNOSIS — M546 Pain in thoracic spine: Secondary | ICD-10-CM

## 2015-09-06 NOTE — Therapy (Signed)
[facility] [address], [place], [place], [address] Phone: [phone]   Fax:  [phone]  Physical Therapy Treatment  Patient Details  Name: Vickie Norris MRN: [id number] Date of Birth: November 12, 1985 Referring Provider: Dr Orland Mustard  Encounter Date: 09/06/2015      PT End of Session - 09/06/15 1606    Visit Number 12   Number of Visits 16   Date for PT Re-Evaluation 10/05/15   Authorization Type BCBS   PT Start Time 1530   PT Stop Time 1615   PT Time Calculation (min) 45 min   Activity Tolerance Patient limited by pain      Past Medical History  Diagnosis Date  . H/O varicella   . H/O bladder infections   . Vulvitis 02/16/2008  . Weight loss 09/2010  . CIN I (cervical intraepithelial neoplasia I) 10/25/10  . Hx: UTI (urinary tract infection) 10/25/10    Past Surgical History  Procedure Laterality Date  . Wisdom tooth extraction      There were no vitals filed for this visit.      Subjective Assessment - 09/06/15 1530    Subjective Yesterday was a rough day b/c of weather.  Hard to get out of bed this morning-- stiffness.  My low back has been hurting and leg too.  Less dragging of LE.  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   How long can you walk comfortably? 15-20 min   Currently in Pain? Yes   Pain Score 4    Pain Location Back   Pain Relieving Factors supine exercises and stretches;  consistency   Pain Score 0   Pain Location Neck            [facility] PT Assessment - 09/06/15 0001    AROM   Right Shoulder Flexion 155 Degrees   Left Shoulder Flexion 155 Degrees   Lumbar Flexion 60   Lumbar Extension 20   Lumbar - Right Side Bend 40   Lumbar - Left Side Bend 40   Strength   Right/Left Hip Left   Left Hip Flexion 4-/5   Left Hip Extension 4-/5   Left Hip ABduction 4-/5   Right/Left Knee Left   Left Knee Flexion 4-/5   Left Knee Extension 4-/5                      [facility] Adult PT Treatment/Exercise - 09/06/15 0001    Lumbar Exercises: Aerobic   UBE (Upper Arm Bike) 6 (3/3) with pillow and lumbar support   Lumbar Exercises: Standing   Heel Raises 20 reps  box formation 10x each direction   Other Standing Lumbar Exercises Trendelenberg ex 5x R/L   Other Standing Lumbar Exercises B UE reach on wall with gluteal squeeze 10x   Knee/Hip Exercises: Machines for Strengthening   Total Gym Leg Press B 50# 10x, left only 15# 10x   Moist Heat Therapy   Number Minutes Moist Heat 15 Minutes   Moist Heat Location Lumbar Spine   Electrical Stimulation   Electrical Stimulation Location lumbar   Electrical Stimulation Action IFC   Electrical Stimulation Parameters 9 ma   Electrical Stimulation Goals Pain                  PT Short Term Goals - 09/06/15 1614    PT SHORT TERM GOAL #1   Title The patient will express good understanding on self  care, posture, body mechanics to promote healing of tissues  08/16/15   Status Achieved   PT SHORT TERM GOAL #2   Title 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   Time 4   Period Weeks   Status On-going   PT SHORT TERM GOAL #4   Title Lumbar flexion improved to 60 degrees, extension to 25 degrees needed for greater ease with transitional movements and future return to dancing, hiking   Time 4   Period Weeks   Status Partially Met           PT Long Term Goals - 09/06/15 1615    PT LONG TERM GOAL #1   Title The patient will be independent in safe self progression of HEP for further improvements and return to full function  09/13/15   Time 8   Period Weeks   Status On-going   PT LONG TERM GOAL #2   Title The patient will be  able to stand for longer periods of time to 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, 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   Time 8   Period Weeks   Status On-going   PT LONG TERM GOAL #3   Title The patient will be able to walk community distances for shopping and exercise for > 30 min with minimal exacerbation of pain   Time 8   Period Weeks   Status On-going   PT LONG TERM GOAL #4   Title The patient will have UE, LE and trunk strength of 4+/5 to 5-/5 needed for pushing/pulling desks and chairs at school    Time 8   Period Weeks   Status On-going   PT LONG TERM GOAL #5   Title FOTO functional outcome score improved from 62% limitation to 36% indicating improved function with less pain   Time 8   Period Weeks   Status On-going               Plan - 09/06/15 1607    Clinical Impression Statement Improvements in lumbar and shoulder AROM although left LE weakness grossly 4-/5 in hip, knee and ankle.  She is able to participate in low level strengthening for LE but states, "I'll be sore later!"  Therapist closely monitoring pain response and correct postural alignment with all.  Discussed sleeping positions (optimal sidelying with body pillow vs. prone).  Recommend continuation of PT with focus on functional strengthening and modalities as needed for pain control.     PT Next Visit Plan Left LE strengthening;  core strengthening, prayer stretch, modalities as needed for pain relief.        Patient will benefit from skilled therapeutic intervention in order to improve the following deficits and impairments:     Visit Diagnosis: Pain in left knee  Pain in right shoulder  Stiffness of left knee, not elsewhere classified  Pain in thoracic spine     Problem List There are no active problems to display for this patient.  Ruben Im, PT 09/06/2015 5:05 PM Phone: [phone] Fax: [phone] [name] 09/06/2015, 5:05 PM  [facility] [address], [place], [place],  [address] Phone: [phone]   Fax:  [phone]  Name: Vickie Norris MRN: [id number] Date of Birth: [birth year]

## 2015-09-08 ENCOUNTER — Encounter: Payer: Self-pay | Admitting: Physical Therapy

## 2015-09-08 ENCOUNTER — Ambulatory Visit: Payer: BLUE CROSS/BLUE SHIELD | Admitting: Physical Therapy

## 2015-09-08 DIAGNOSIS — M546 Pain in thoracic spine: Secondary | ICD-10-CM

## 2015-09-08 DIAGNOSIS — M25562 Pain in left knee: Secondary | ICD-10-CM

## 2015-09-08 DIAGNOSIS — M25662 Stiffness of left knee, not elsewhere classified: Secondary | ICD-10-CM

## 2015-09-08 DIAGNOSIS — M25511 Pain in right shoulder: Secondary | ICD-10-CM
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
## 2015-09-13 ENCOUNTER — Ambulatory Visit: Payer: BC Managed Care – PPO | Admitting: Physical Therapy

## 2015-09-20 ENCOUNTER — Ambulatory Visit: Payer: BLUE CROSS/BLUE SHIELD | Admitting: Physical Therapy

## 2015-09-20 ENCOUNTER — Encounter: Payer: Self-pay | Admitting: Physical Therapy

## 2015-09-20 DIAGNOSIS — M25662 Stiffness of left knee, not elsewhere classified: Secondary | ICD-10-CM

## 2015-09-20 DIAGNOSIS — M25562 Pain in left knee: Secondary | ICD-10-CM | POA: Diagnosis not present

## 2015-09-20 DIAGNOSIS — M546 Pain in thoracic spine: Secondary | ICD-10-CM

## 2015-09-20 DIAGNOSIS — M25511 Pain in right shoulder: Secondary | ICD-10-CM
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
## 2015-09-22 ENCOUNTER — Ambulatory Visit: Payer: BLUE CROSS/BLUE SHIELD | Admitting: Physical Therapy

## 2015-09-22 DIAGNOSIS — M545 Low back pain, unspecified: Secondary | ICD-10-CM

## 2015-09-22 DIAGNOSIS — M25511 Pain in right shoulder: Secondary | ICD-10-CM

## 2015-09-22 DIAGNOSIS — M25562 Pain in left knee: Secondary | ICD-10-CM | POA: Diagnosis not present

## 2015-09-22 DIAGNOSIS — M25662 Stiffness of left knee, not elsewhere classified: Secondary | ICD-10-CM

## 2015-09-22 NOTE — Therapy (Signed)
[facility] [address] [place], [place], [address] Phone: [phone]   Fax:  [phone]  Physical Therapy Treatment  Patient Details  Name: Vickie Norris MRN: [id number] Date of Birth: 11/15/1985 Referring Provider: Dr Orland Mustard  Encounter Date: 09/22/2015      PT End of Session - 09/22/15 1516    Visit Number 15   Date for PT Re-Evaluation 10/05/15   Authorization Type BCBS   PT Start Time 1445   PT Stop Time 1530   PT Time Calculation (min) 45 min   Activity Tolerance Patient tolerated treatment well      Past Medical History  Diagnosis Date  . H/O varicella   . H/O bladder infections   . Vulvitis 02/16/2008  . Weight loss 09/2010  . CIN I (cervical intraepithelial neoplasia I) 10/25/10  . Hx: UTI (urinary tract infection) 10/25/10    Past Surgical History  Procedure Laterality Date  . Wisdom tooth extraction      There were no vitals filed for this visit.      Subjective Assessment - 09/22/15 1447    Subjective Mock testing at work causing some discomfort today.  Some muscle cramps in the morning.  Hoping to try walking some this weekend to test walking tolerance.  I feel like my leg is getting stronger but it is still weak.     Currently in Pain? Yes   Pain Score 3    Pain Location Back                         [facility] Adult PT Treatment/Exercise - 09/22/15 0001    Lumbar Exercises: Standing   Row Power tower;Strengthening;Both;15 reps  20#   Other Standing Lumbar Exercises ab brace with red band pull downs forward, diagonals 3x10;   Psoas doorway stretch 3x 5 right and left   Knee/Hip Exercises: Aerobic   Nustep L1 6 min Seat 7    Knee/Hip Exercises: Machines for Strengthening   Cybex Knee Extension 10# B 10x, single leg 10# left 2x5, right 10x   Cybex Knee Flexion 20# B, 15# R/L 10x each   Total Gym Leg Press B 65# 2x10x, Lt 30# 1 x10 seat 6   Moist Heat Therapy   Number  Minutes Moist Heat 15 Minutes   Moist Heat Location Lumbar Spine   Electrical Stimulation   Electrical Stimulation Location lumbar   Electrical Stimulation Action IFC   Electrical Stimulation Parameters 9 ma   Electrical Stimulation Goals Pain                  PT Short Term Goals - 09/22/15 1521    PT SHORT TERM GOAL #1   Title The patient will express good understanding on self care, posture, body mechanics to promote healing of tissues  08/16/15   Status Achieved   PT SHORT TERM GOAL #2   Title 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   Time 8   Period Weeks   Status On-going   PT LONG TERM GOAL #3   Title The patient will be able to walk community distances for shopping and exercise for > 30 min with minimal exacerbation of pain   Time 8   Period Weeks   Status On-going   PT LONG TERM GOAL #4   Title The patient will have UE, LE and trunk strength of 4+/5 to 5-/5 needed for pushing/pulling desks and chairs at school    Time 8   Period Weeks   Status On-going   PT LONG  TERM GOAL #5   Title FOTO functional outcome score improved from 62% limitation to 36% indicating improved function with less pain   Time 8   Period Weeks   Status On-going               Plan - 09/22/15 1516    Clinical Impression Statement The patient reports LE strengthening is challenging but not painful in her back or knee.  Therapist closely monitoring response and modifying to balance standing and walking time.  Continue with functional reactivation for 1-2 more weeks to prepare for discharge to independent HEP and safe self progression.     PT Next Visit Plan Left LE strengthening;  core strengthening,  modalities as needed for pain relief, recheck lumbar AROM; try light to medium lifting       Patient will benefit from skilled therapeutic intervention in order to improve the following deficits and impairments:     Visit Diagnosis: Pain in left knee  Pain in right shoulder  Stiffness of left knee, not elsewhere classified  Bilateral low back pain without sciatica     Problem List There are no active problems to display for this patient.   Stacy Simpson, PT 09/22/2015 3:25 PM Phone: [phone] Fax: [phone]   Simpson, Stacy C 09/22/2015, 3:24 PM  [facility] [address], [address] [place], [place], [address] Phone: [phone]   Fax:  [phone]  Name: Adellyn Wanat MRN: [id number] Date of Birth: 11/21/1985     

## 2015-09-27 ENCOUNTER — Ambulatory Visit: Payer: BLUE CROSS/BLUE SHIELD | Admitting: Physical Therapy

## 2015-09-27 DIAGNOSIS — M25562 Pain in left knee: Secondary | ICD-10-CM | POA: Diagnosis not present

## 2015-09-27 DIAGNOSIS — M545 Low back pain, unspecified: Secondary | ICD-10-CM

## 2015-09-27 DIAGNOSIS — M25662 Stiffness of left knee, not elsewhere classified: Secondary | ICD-10-CM

## 2015-09-27 DIAGNOSIS — M25511 Pain in right shoulder: Secondary | ICD-10-CM

## 2015-09-27 NOTE — Therapy (Signed)
[facility] [address], [place], [place], [address] Phone: [phone]   Fax:  [phone]  Physical Therapy Treatment  Patient Details  Name: Vickie Norris MRN: [id number] Date of Birth: Jun 15, 1985 Referring Provider: Dr Orland Mustard  Encounter Date: 09/27/2015      PT End of Session - 09/27/15 1603    Visit Number 16   Number of Visits 19   Date for PT Re-Evaluation 10/05/15   Authorization Type BCBS   PT Start Time 1530   PT Stop Time 1610   PT Time Calculation (min) 40 min   Activity Tolerance Patient tolerated treatment well      Past Medical History  Diagnosis Date  . H/O varicella   . H/O bladder infections   . Vulvitis 02/16/2008  . Weight loss 09/2010  . CIN I (cervical intraepithelial neoplasia I) 10/25/10  . Hx: UTI (urinary tract infection) 10/25/10    Past Surgical History  Procedure Laterality Date  . Wisdom tooth extraction      There were no vitals filed for this visit.      Subjective Assessment - 09/27/15 1530    Subjective "It's a rainy day and I feel fine."  "Today is a great day!"   Applying to grad school.  When it stops raining I'm going to walk in the park.     Currently in Pain? No/denies   Pain Score 0-No pain                         [facility] Adult PT Treatment/Exercise - 09/27/15 0001    Lumbar Exercises: Standing   Other Standing Lumbar Exercises SLS on left with press up, press forward, marching, mini lunges bilaterally   Knee/Hip Exercises: Aerobic   Nustep L1 8 min Seat 7   Knee/Hip Exercises: Machines for Strengthening   Cybex Knee Extension 15# B;  10# Left only   Cybex Knee Flexion 25# B, 15# single leg R/L10x each   Knee/Hip Exercises: Standing   Walking with Sports Cord 20# backward and forward 7x each   Other Standing Knee Exercises side step, high step, butt kicks 2x    Other Standing Knee Exercises Box lifting knees to chest 10x                   PT Short Term Goals - 09/27/15 1610    PT SHORT TERM GOAL #1   Title The patient will express good understanding on self care, posture, body mechanics to promote healing of tissues  08/16/15   Status Achieved   PT SHORT TERM GOAL #2   Title 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   Time 4   Period Weeks   Status On-going   PT SHORT TERM GOAL #4   Title Lumbar flexion improved to 60 degrees, extension to 25 degrees needed for greater ease with transitional movements and future return to dancing, hiking   Time 4   Period Weeks   Status Partially Met           PT Long Term Goals - 09/27/15 1610    PT LONG TERM GOAL #1   Title The patient will be independent in  safe self progression of HEP for further improvements and return to full function  09/13/15   Time 8   Period Weeks   Status On-going   PT LONG TERM GOAL #2   Title The patient will be able to stand for longer periods of time to 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, 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   Time 8   Period Weeks   Status On-going   PT LONG TERM GOAL #3   Title The patient will be able to walk community distances for shopping and exercise for > 30 min with minimal exacerbation of pain   Time 8   Period Weeks   Status On-going   PT LONG TERM GOAL #4   Title The patient will have UE, LE and trunk strength of 4+/5 to 5-/5 needed for pushing/pulling desks and chairs at school    Time 8   Period Weeks   Status On-going   PT LONG TERM GOAL #5   Title FOTO functional outcome score improved from 62% limitation to 36% indicating improved function with less pain   Time 8   Period Weeks   Status On-going               Plan - 09/27/15 1604    Clinical Impression  Statement The patient with excellent improvement in pain intensity.  Able to perform progressive core and LE strengthening exercises with muscle fatigue and minor soreness but no major pain exacerbation.  She declines the need for modalities today.  Should meet remaining goals in next 2 visits.  Therapist closely monitoring response and proper body mechanics with all.     PT Next Visit Plan Left LE strengthening;  core strengthening,  modalities as needed for pain relief, light to medium lifting       Patient will benefit from skilled therapeutic intervention in order to improve the following deficits and impairments:     Visit Diagnosis: Pain in left knee  Pain in right shoulder  Stiffness of left knee, not elsewhere classified  Bilateral low back pain without sciatica     Problem List There are no active problems to display for this patient. Ruben Im, PT 09/27/2015 4:12 PM Phone: [phone] Fax: [phone]  [name] 09/27/2015, 4:11 PM  [facility] [address] [place], [place], [address] Phone: [phone]   Fax:  [phone]  Name: Vickie Norris MRN: [id number] Date of Birth: [birth year]

## 2015-09-29 ENCOUNTER — Ambulatory Visit: Payer: BLUE CROSS/BLUE SHIELD | Admitting: Physical Therapy

## 2015-09-29 DIAGNOSIS — M25562 Pain in left knee: Secondary | ICD-10-CM

## 2015-09-29 DIAGNOSIS — M545 Low back pain, unspecified: Secondary | ICD-10-CM

## 2015-09-29 DIAGNOSIS — M25662 Stiffness of left knee, not elsewhere classified: Secondary | ICD-10-CM

## 2015-09-29 DIAGNOSIS — M25511 Pain in right shoulder: Secondary | ICD-10-CM

## 2015-09-29 NOTE — Therapy (Addendum)
[facility] [address], [place], [place], [address] Phone: [phone]   Fax:  [phone]  Physical Therapy Treatment/Discharge Summary  Patient Details  Name: Vickie Norris MRN: [id number] Date of Birth: [birth year] Referring Provider: Dr Orland Mustard  Encounter Date: 09/29/2015      PT End of Session - 09/29/15 1517    Visit Number 17   Number of Visits 19   Date for PT Re-Evaluation 10/05/15   Authorization Type BCBS   PT Start Time 1450   PT Stop Time 1528   PT Time Calculation (min) 38 min   Activity Tolerance Patient tolerated treatment well      Past Medical History  Diagnosis Date  . H/O varicella   . H/O bladder infections   . Vulvitis 02/16/2008  . Weight loss 09/2010  . CIN I (cervical intraepithelial neoplasia I) 10/25/10  . Hx: UTI (urinary tract infection) 10/25/10    Past Surgical History  Procedure Laterality Date  . Wisdom tooth extraction      There were no vitals filed for this visit.      Subjective Assessment - 09/29/15 1449    Subjective "It actually went well last time."     Currently in Pain? Yes   Pain Score 2    Pain Location Back   Pain Orientation Left   Pain Descriptors / Indicators Cramping                         [facility] Adult PT Treatment/Exercise - 09/29/15 0001    Lumbar Exercises: Aerobic   Elliptical Level 1; incline 3 5 min   UBE (Upper Arm Bike) 2/2 4 min   Lumbar Exercises: Machines for Strengthening   Other Lumbar Machine Exercise Seated rows 20# 20x   Other Lumbar Machine Exercise Lat bar seated 20# 20x   Lumbar Exercises: Standing   Other Standing Lumbar Exercises 7 # lifting knee level to chest 10x   Knee/Hip Exercises: Machines for Strengthening   Total Gym Leg Press B 70# 2x10x, Lt 35# 1 x10 seat 6   Other Machine Power tower pull and press 20# 10x each   Knee/Hip Exercises: Standing   Other Standing Knee Exercises red band side  stepping, cowboys, tandem walk   Other Standing Knee Exercises heel and toe walk                  PT Short Term Goals - 09/29/15 1521    PT SHORT TERM GOAL #1   Title The patient will express good understanding on self care, posture, body mechanics to promote healing of tissues  08/16/15   Status Achieved   PT SHORT TERM GOAL #2   Status Achieved   PT SHORT TERM GOAL #3   Title The patient will be able lift light to medium objects (grocery bag) with minimal complaint of pain.   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, 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   Time 8   Period Weeks   Status On-going   PT LONG TERM GOAL #3   Title The patient will be able to walk community distances for shopping and exercise for > 30 min with minimal exacerbation of pain   Time 8   Period Weeks   Status On-going   PT LONG TERM GOAL #4   Title The patient will have UE, LE and trunk strength of 4+/5 to 5-/5 needed for pushing/pulling desks and chairs at school    Time 8   Period Weeks   Status On-going   PT LONG TERM GOAL #5   Title FOTO functional outcome score improved from 62% limitation to 36% indicating improved function with less pain   Time 8   Period Weeks   Status On-going               Plan - 09/29/15 1517    Clinical Impression Statement The patient is able to participate in a moderate level of  core, UE and LE strengthening without an exacerbation of pain.  Therapist closely monitoring for proper lifting technique, patellofemoral alignment and pain response.  Anticipate she will meet remaining goals next week and will be ready for discharge from PT.     PT Next Visit Plan FOTO;  check ROM; check remaining goals;  HEP review      Patient will benefit from skilled therapeutic intervention in order to improve the following deficits and impairments:     Visit Diagnosis: Pain in left knee  Pain in right shoulder  Stiffness of left knee, not elsewhere classified  Bilateral low back pain without sciatica    PHYSICAL THERAPY DISCHARGE SUMMARY  Visits from Start of Care: 17  Current functional level related to goals / functional outcomes: The patient progressed slowly but steadily with rehab.  Over the last 2 weeks of PT, she was able to participate in moderate level strengthening without pain exacerbation and minimal pain with usual ADLs.  She no-showed for her last scheduled visit, in which discharge was planned.  Called and spoke with patient and she reports she missed her appt secondary to work obligations.  She planned to call to reschedule the last appt but called on 6/14 to request discharge stating, she was doing well.     Remaining deficits: Unable to assess further progress toward goals.     Education / Equipment: HEP Plan: Patient agrees to discharge.  Patient goals were partially met. Patient is being discharged due to being pleased with the current functional level.  ?????        Problem List There are no active problems to display for this patient.   Ruben Im, PT 09/29/2015 3:29 PM Phone: [phone] Fax: [phone]  [name] 09/29/2015, 3:27 PM  [facility] [address] [place], [place], [address] Phone: [phone]   Fax:  [phone]  Name: Jacky Hartung MRN:  [id number] Date of Birth: 04-Feb-1986

## 2015-10-04 ENCOUNTER — Ambulatory Visit: Payer: BLUE CROSS/BLUE SHIELD | Admitting: Physical Therapy

## 2015-10-06 ENCOUNTER — Telehealth: Payer: Self-pay | Admitting: Physical Therapy

## 2015-10-06 NOTE — Telephone Encounter (Signed)
Called and spoke with patient regarding her missed appt on 5/30.  She states she got busy at work with [employer] and a meeting and forgot her last scheduled appt.  She would like to come in for that last appointment for final outcomes check, ROM and strength reassessment for planned discharge.  She will call back to reschedule on Monday.

## 2016-03-30 ENCOUNTER — Telehealth: Payer: Self-pay
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
## 2016-04-05 NOTE — Telephone Encounter (Signed)
[name]   Do you have anything on this patient

## 2016-04-05 NOTE — Telephone Encounter (Signed)
No I do not have any request from this patient or from a law firm about this patient. She either needs to bring in the release from her lawyer or have them re-fax it to [phone]
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
## 2017-02-21 LAB — OB RESULTS CONSOLE RUBELLA ANTIBODY, IGM: Rubella: IMMUNE

## 2017-02-21 LAB — OB RESULTS CONSOLE HIV ANTIBODY (ROUTINE TESTING): HIV: NONREACTIVE

## 2017-02-21 LAB — OB RESULTS CONSOLE GC/CHLAMYDIA
CHLAMYDIA, DNA PROBE: NEGATIVE
Gonorrhea: NEGATIVE

## 2017-02-21 LAB — OB RESULTS CONSOLE ABO/RH: RH TYPE: POSITIVE

## 2017-02-21 LAB — OB RESULTS CONSOLE HEPATITIS B SURFACE ANTIGEN: Hepatitis B Surface Ag: NEGATIVE

## 2017-02-21 LAB — OB RESULTS CONSOLE RPR: RPR: NONREACTIVE

## 2017-02-21 LAB — OB RESULTS CONSOLE ANTIBODY SCREEN: ANTIBODY SCREEN: NEGATIVE

## 2017-05-07 NOTE — L&D Delivery Note (Signed)
Cesarean Section Procedure Note  Indications: failure to progress: arrest of dilation, non-reassuring fetal status and prolonged premature rupture of membranes  Pre-operative Diagnosis: 41 week 0 day pregnancy.  Post-operative Diagnosis: same  Surgeon: [id number],JYNWG A   Assistants: Illene Bolus CNm  Anesthesia: Spinal anesthesia  ASA Class: per anesthesia   Procedure Details   The patient was seen in the Holding Room. The risks, benefits, complications, treatment options, and expected outcomes were discussed with the patient.  The patient concurred with the proposed plan, giving informed consent.  The site of surgery properly noted/marked. The patient was taken to Operating Room # 9, identified as Vickie Norris and the procedure verified as C-Section Delivery. A Time Out was held and the above information confirmed.  After induction of anesthesia, the patient was draped and prepped in the usual sterile manner. A Pfannenstiel incision was made and carried down through the subcutaneous tissue to the fascia. Fascial incision was made and extended transversely. The fascia was separated from the underlying rectus tissue superiorly and inferiorly. The peritoneum was identified and entered. Peritoneal incision was extended longitudinally. The utero-vesical peritoneal reflection was incised transversely and the bladder flap was bluntly freed from the lower uterine segment. A low transverse uterine incision was made. Delivered from cephalic, using the vaccuum with one pull in the green zone. vertex  Presentation.  The vacuum was used because of a narrow pelvis it was difficult to delivery the baby with my Apgar scores of 8 at one minute and 9 at five minutes. After the umbilical cord was clamped and cut cord blood was obtained for evaluation. The placenta was removed intact and appeared normal. The uterine outline, tubes and ovaries appeared normal. The uterine incision was closed with running locked  sutures of 0 Vicryl with two layer closure.  Surgicel was placed along the uterine incision for some mild oozing   Hemostasis was observed. Lavage was carried out until clear. The fascia was then reapproximated with running sutures of Vicryl. The skin was reapproximated with 3.0 and Monocryl.  Instrument, sponge, and needle counts were correct prior the abdominal closure and at the conclusion of the case.   Findings: Female infant vertex,. Clear fluid normal appearing uterus and ovaries  Estimated Blood Loss:  971 ml         Drains: na         Total IV Fluids:         Specimens: Placenta          Implants: na         Complications:  None; patient tolerated the procedure well.         Disposition: PACU - hemodynamically stable.         Condition: stable  Attending Attestation: I performed the procedure.

## 2017-08-21 LAB — OB RESULTS CONSOLE GBS: STREP GROUP B AG: NEGATIVE

## 2017-09-12 ENCOUNTER — Telehealth (HOSPITAL_COMMUNITY): Payer: Self-pay | Admitting: *Deleted

## 2017-09-12 ENCOUNTER — Encounter (HOSPITAL_COMMUNITY): Payer: Self-pay | Admitting: *Deleted

## 2017-09-12 NOTE — Telephone Encounter (Signed)
Preadmission screen  

## 2017-09-23 ENCOUNTER — Other Ambulatory Visit: Payer: Self-pay | Admitting: Obstetrics and Gynecology

## 2017-09-24 ENCOUNTER — Encounter (HOSPITAL_COMMUNITY): Payer: Self-pay | Admitting: *Deleted

## 2017-09-24 ENCOUNTER — Inpatient Hospital Stay (HOSPITAL_COMMUNITY)
Admission: AD | Admit: 2017-09-24 | Discharge: 2017-09-28 | DRG: 788 | Disposition: A | Discharge status: Home / Self Care | Payer: Medicaid Other | Source: Ambulatory Visit | Attending: Obstetrics and Gynecology | Admitting: Obstetrics and Gynecology

## 2017-09-24 DIAGNOSIS — O99214 Obesity complicating childbirth: Secondary | ICD-10-CM | POA: Diagnosis present

## 2017-09-24 DIAGNOSIS — O4292 Full-term premature rupture of membranes, unspecified as to length of time between rupture and onset of labor: Secondary | ICD-10-CM | POA: Diagnosis present

## 2017-09-24 DIAGNOSIS — D649 Anemia, unspecified: Secondary | ICD-10-CM | POA: Diagnosis present

## 2017-09-24 DIAGNOSIS — E669 Obesity, unspecified: Secondary | ICD-10-CM | POA: Diagnosis present

## 2017-09-24 DIAGNOSIS — O9902 Anemia complicating childbirth: Secondary | ICD-10-CM | POA: Diagnosis present

## 2017-09-24 DIAGNOSIS — M955 Acquired deformity of pelvis: Secondary | ICD-10-CM | POA: Diagnosis present

## 2017-09-24 DIAGNOSIS — Z3A40 40 weeks gestation of pregnancy: Secondary | ICD-10-CM

## 2017-09-24 DIAGNOSIS — Z98891 History of uterine scar from previous surgery: Secondary | ICD-10-CM

## 2017-09-24 DIAGNOSIS — O4290 Premature rupture of membranes, unspecified as to length of time between rupture and onset of labor, unspecified weeks of gestation: Secondary | ICD-10-CM | POA: Diagnosis present

## 2017-09-24 HISTORY — DX: Anemia, unspecified: D64.9

## 2017-09-24 HISTORY — DX: Gastro-esophageal reflux disease without esophagitis: K21.9

## 2017-09-24 HISTORY — DX: Headache: R51

## 2017-09-24 HISTORY — DX: Unspecified abnormal cytological findings in specimens from vagina: R87.629

## 2017-09-24 HISTORY — DX: Headache, unspecified: R51.9

## 2017-09-24 LAB — CBC
HCT: 33.7 % — ABNORMAL LOW (ref 36.0–46.0)
HEMOGLOBIN: 11.2 g/dL — AB (ref 12.0–15.0)
MCH: 30.3 pg (ref 26.0–34.0)
MCHC: 33.2 g/dL (ref 30.0–36.0)
MCV: 91.1 fL (ref 78.0–100.0)
PLATELETS: 243 10*3/uL (ref 150–400)
RBC: 3.7 MIL/uL — ABNORMAL LOW (ref 3.87–5.11)
RDW: 14.8 % (ref 11.5–15.5)
WBC: 12.2 10*3/uL — ABNORMAL HIGH (ref 4.0–10.5)

## 2017-09-24 LAB — POCT FERN TEST
POCT FERN TEST: NEGATIVE
POCT FERN TEST: POSITIVE

## 2017-09-24 LAB — TYPE AND SCREEN
ABO/RH(D): O POS
ANTIBODY SCREEN: NEGATIVE

## 2017-09-24 LAB — RPR: RPR Ser Ql: NONREACTIVE

## 2017-09-24 LAB — ABO/RH: ABO/RH(D): O POS

## 2017-09-24 MED ORDER — OXYTOCIN 40 UNITS IN LACTATED RINGERS INFUSION - SIMPLE MED
2.5000 [IU]/h | INTRAVENOUS | Status: DC
  Filled 2017-09-24: qty 1000

## 2017-09-24 MED ORDER — FLEET ENEMA 7-19 GM/118ML RE ENEM: 1.0000 | ENEMA | Freq: Every day | RECTAL | Status: DC | PRN

## 2017-09-24 MED ORDER — FENTANYL CITRATE (PF) 100 MCG/2ML IJ SOLN: 100.0000 ug | INTRAMUSCULAR | Status: DC | PRN

## 2017-09-24 MED ORDER — SODIUM CHLORIDE 0.9 % IV SOLN
3.0000 g | Freq: Four times a day (QID) | INTRAVENOUS | Status: DC
  Administered 2017-09-24 – 2017-09-25 (×2): 3 g via INTRAVENOUS
  Filled 2017-09-24 (×3): qty 3

## 2017-09-24 MED ORDER — LACTATED RINGERS IV SOLN
INTRAVENOUS | Status: DC
  Administered 2017-09-24 – 2017-09-25 (×6): via INTRAVENOUS

## 2017-09-24 MED ORDER — ONDANSETRON HCL 4 MG/2ML IJ SOLN: 4.0000 mg | Freq: Four times a day (QID) | INTRAMUSCULAR | Status: DC | PRN

## 2017-09-24 MED ORDER — OXYCODONE-ACETAMINOPHEN 5-325 MG PO TABS: 2.0000 | ORAL_TABLET | ORAL | Status: DC | PRN

## 2017-09-24 MED ORDER — PROMETHAZINE HCL 25 MG/ML IJ SOLN: 12.5000 mg | Freq: Four times a day (QID) | INTRAMUSCULAR | Status: DC | PRN

## 2017-09-24 MED ORDER — LIDOCAINE HCL (PF) 1 % IJ SOLN: 30.0000 mL | INTRAMUSCULAR | Status: DC | PRN

## 2017-09-24 MED ORDER — OXYTOCIN 40 UNITS IN LACTATED RINGERS INFUSION - SIMPLE MED
1.0000 m[IU]/min | INTRAVENOUS | Status: DC
  Administered 2017-09-24: 1 m[IU]/min via INTRAVENOUS

## 2017-09-24 MED ORDER — LACTATED RINGERS IV SOLN
500.0000 mL | INTRAVENOUS | Status: DC | PRN
  Administered 2017-09-24 – 2017-09-25 (×2): 500 mL via INTRAVENOUS

## 2017-09-24 MED ORDER — TERBUTALINE SULFATE 1 MG/ML IJ SOLN
0.2500 mg | Freq: Once | INTRAMUSCULAR | Status: DC | PRN
  Filled 2017-09-24: qty 1

## 2017-09-24 MED ORDER — SOD CITRATE-CITRIC ACID 500-334 MG/5ML PO SOLN
30.0000 mL | ORAL | Status: DC | PRN
  Administered 2017-09-25: 30 mL via ORAL
  Filled 2017-09-24: qty 15

## 2017-09-24 MED ORDER — OXYTOCIN BOLUS FROM INFUSION: 500.0000 mL | Freq: Once | INTRAVENOUS | Status: DC

## 2017-09-24 MED ORDER — OXYCODONE-ACETAMINOPHEN 5-325 MG PO TABS: 1.0000 | ORAL_TABLET | ORAL | Status: DC | PRN

## 2017-09-24 MED ORDER — ACETAMINOPHEN 325 MG PO TABS: 650.0000 mg | ORAL_TABLET | ORAL | Status: DC | PRN

## 2017-09-24 MED ORDER — OXYTOCIN 40 UNITS IN LACTATED RINGERS INFUSION - SIMPLE MED: 1.0000 m[IU]/min | INTRAVENOUS | Status: DC

## 2017-09-24 NOTE — Progress Notes (Signed)
Vickie Norris is a 32 y.o. G1P0 at [redacted]w[redacted]d by admitted for rupture of membranes  Subjective: Feeling well, not feeling uterine contractions.   Objective: Vitals:   09/24/17 0345 09/24/17 0350  BP: 127/68   Pulse: 86   Temp:  98.5 F (36.9 C)  TempSrc:  Oral  Weight:  91.6 kg (202 lb)  Height:   (1.626 m)    FHT:  FHR: 145 bpm, variability: moderate,  accelerations:  Present,  decelerations:  Present -patient had one prolonged decel to 60bpm with return to baseline after patient repositioned from left lateral to right lateral, occasional early decels, overall reassuring FHTs UC:   irregular, every 4-6 minutes SVE:   Dilation: Fingertip Effacement (%): 70 Station: -3 Exam by:: greer,cnm  Labs: Lab Results  Component Value Date   WBC 12.2 (H) 09/24/2017   HGB 11.2 (L) 09/24/2017   HCT 33.7 (L) 09/24/2017   MCV 91.1 09/24/2017   PLT 243 09/24/2017    Assessment / Plan: Early labor  Labor: Progressing normally Preeclampsia:  no signs or symptoms of toxicity, intake and ouput balanced and labs stable Fetal Wellbeing:  Category II now category I Pain Control:  Epidural, IV pain meds and Nitrous Oxide I/D:  n/a Anticipated MOD:  NSVD  Vickie Norris 09/24/2017, 6:05 AM

## 2017-09-24 NOTE — Progress Notes (Signed)
Patient ID: Vickie Norris, female   DOB: 12/03/1985, 32 y.o.   MRN: [id number]  Pt without c/o BP 112/71   Pulse 84   Temp 97.9 F (36.6 C) (Oral)   Resp 18   Ht  (1.626 m)   Wt 91.6 kg (202 lb)   LMP 12/12/2016   BMI 34.67 kg/m  Cat 1 NST toco irregular contractions Prolonged ROM will start unasyn to prevent pp endometritis Pt with no progress for almost 24 hours however she would like to try to continue pitocin to have a SVD

## 2017-09-24 NOTE — Progress Notes (Signed)
Patient ID: Vickie Norris, female   DOB: [birth year], 32 y.o.   MRN: [id number] Pt without c/o BP 118/65   Pulse 81   Temp 97.8 F (36.6 C) (Oral)   Resp 18   Ht  (1.626 m)   Wt 91.6 kg (202 lb)   LMP 12/12/2016   BMI 34.67 kg/m  FHTS 130-145 with accels and good LTV occ late decel or deep variable toco q1-2 minutes Pt has hyperstimulation DC pitocin Start it back in one hour at half dose.  If hyperstimulation persists will stop pitocin and try to allow pt to labor on her own No cervical change If fetal strip becomes non reassuring would recommend CS Pt and her husband understand and agree with the plan

## 2017-09-24 NOTE — Progress Notes (Signed)
Patient ID: Vickie Norris, female   DOB: May 22, 1985, 32 y.o.   MRN: [id number]  Pt is comfortable.  LOF BP (!) 108/54   Pulse 93   Temp 98.3 F (36.8 C) (Oral)   Resp 20   Ht  (1.626 m)   Wt 91.6 kg (202 lb)   LMP 12/12/2016   BMI 34.67 kg/m  Cat 1 NST toco irreg contractions Earlier there were deep variables present but resolved with positon changes Will monitor closely  Anticipate SVD

## 2017-09-24 NOTE — Progress Notes (Signed)
Patient ID: Vickie Norris, female   DOB: Jul 23, 1985, 32 y.o.   MRN: [id number]  Pt uncomfortable in the bed BP (!) 104/54   Pulse 77   Temp 97.8 F (36.6 C) (Oral)   Resp 20   Ht  (1.626 m)   Wt 91.6 kg (202 lb)   LMP 12/12/2016   BMI 34.67 kg/m  Cat 1 occ early decel toco q2-5  cx barely a FT/70/-3 Will start pitocin Pt may have pain meds when ready Will place IUPC when pt is dilated Anticipate SVD

## 2017-09-24 NOTE — Progress Notes (Signed)
Patient ID: Vickie Norris, female   DOB: 1985-06-14, 32 y.o.   MRN: [id number]  Tracing reviewed remotely.  Spontaneous decel with recovery noted.  Currently back at baseline with good variability and accelerations, which is reassuring.  Will continue to monitor closely.  Shela Nevin Dion Body, MD

## 2017-09-24 NOTE — H&P (Addendum)
Subjective:  Vickie Norris is a 32 y.o. G1 P0 female with EDC 09/18/17 at 40 and 6/[redacted] weeks gestation who is being admitted for rupture of membranes.  Patient reports occasional contractions and rupture of membranes as 0030.   Fetal Movement: normal.     Objective/ Prenatal Transfer Tool: Maternal Diabetes: None Genetic Screening: Declined Maternal Ultrasounds/Referrals: None Fetal Ultrasounds or other Referrals:  [facility]-05/29/17 Vertex, EFW 1lb7oz,48%ile, cervix 4.47, placenta 5.8cm from os, AFI WNL, posterior placenta.  Maternal Substance Abuse: None Significant Maternal Medications: None  Significant Maternal Lab Results:  None Other Comments:  None  Vitals:   09/24/17 0345 09/24/17 0350  BP: 127/68   Pulse: 86   Temp:  98.5 F (36.9 C)  TempSrc:  Oral  Weight:  91.6 kg (202 lb)  Height:   (1.626 m)   Results for orders placed or performed during the hospital encounter of 09/24/17 (from the past 24 hour(s))  POCT fern test     Status: None   Collection Time: 09/24/17  2:59 AM  Result Value Ref Range   POCT Fern Test Negative = intact amniotic membranes   Fern Test     Status: None   Collection Time: 09/24/17  2:59 AM  Result Value Ref Range   POCT Fern Test Positive = ruptured amniotic membanes   CBC     Status: Abnormal   Collection Time: 09/24/17  3:10 AM  Result Value Ref Range   WBC 12.2 (H) 4.0 - 10.5 K/uL   RBC 3.70 (L) 3.87 - 5.11 MIL/uL   Hemoglobin 11.2 (L) 12.0 - 15.0 g/dL   HCT 78.2 (L) 95.6 - 21.3 %   MCV 91.1 78.0 - 100.0 fL   MCH 30.3 26.0 - 34.0 pg   MCHC 33.2 30.0 - 36.0 g/dL   RDW 08.6 57.8 - 46.9 %   Platelets 243 150 - 400 K/uL  Type and screen [facility]     Status: None   Collection Time: 09/24/17  3:10 AM  Result Value Ref Range   ABO/RH(D) O POS    Antibody Screen NEG    Sample Expiration      09/27/2017 Performed at [facility], [address], [place], [place] [address]     HT:  135 with moderate beat to  beat variability, +accels, +decels (occasional early decels), overall reassuring  Uterine Size: size equals dates  Presentations: cephalic  Cervix:    Dilation: Fingertip    Effacement: 75%   Station:  -3   Consistency: soft   Position: posterior   Review of Systems  Gastrointestinal: Positive for abdominal pain.  All other systems reviewed and are negative.  Physical Exam  Constitutional: She is oriented to person, place, and time and well-developed, well-nourished, and in no distress.  HENT:  Head: Normocephalic and atraumatic.  Eyes: Pupils are equal, round, and reactive to light.  Cardiovascular: Normal rate and regular rhythm.  Pulmonary/Chest: Effort normal and breath sounds normal.  Abdominal: Soft. Bowel sounds are normal.  Genitourinary: Vagina normal and cervix normal.  Musculoskeletal: Normal range of motion.  Neurological: She is alert and oriented to person, place, and time.  Skin: Skin is warm and dry.  Psychiatric: Mood, memory, affect and judgment normal.    Lab Review  O, Rh+, Rubella-immune, Hepatitis B surface antigen non-reactive, GBS negative  WUX:LKGMWNU declined  One hour GTT: Normal    Assessment/Plan:  32 y.o. G1P0 at 71 and 4/[redacted] weeks gestation. SROM Category 1 FHTs  Admit to L&D IOL in the morning if needed     Risks, benefits, alternatives and possible complications have been discussed in detail with the patient.  Pre-admission, admission, and post admission procedures and expectations were discussed in detail.  All questions answered, all appropriate consents will be signed at the Hospital. Admission is planned for today.  Anticipate vaginal delivery.

## 2017-09-24 NOTE — Anesthesia Pain Management Evaluation Note (Signed)
+   CRNA Pain Management Visit Note  Patient: Vickie Norris, 32 y.o., female  "Hello I am a member of the anesthesia team at [facility]. We have an anesthesia team available at all times to provide care throughout the hospital, including epidural management and anesthesia for C-section. I don't know your plan for the delivery whether it a natural birth, water birth, IV sedation, nitrous supplementation, doula or epidural, but we want to meet your pain goals."   1.Was your pain managed to your expectations on prior hospitalizations?   No prior hospitalizations  2.What is your expectation for pain management during this hospitalization?     Epidural  3.How can we help you reach that goal? Epidural @ pain goal.  Record the patient's initial score and the patient's pain goal.   Pain: 0  Pain Goal: 5 [facility] wants you to be able to say your pain was always managed very well.  Tramane Gorum 09/24/2017

## 2017-09-25 ENCOUNTER — Inpatient Hospital Stay (HOSPITAL_COMMUNITY): Payer: Medicaid Other | Admitting: Anesthesiology

## 2017-09-25 ENCOUNTER — Encounter (HOSPITAL_COMMUNITY)
Admission: AD | Disposition: A | Discharge status: Home / Self Care | Payer: Self-pay | Source: Ambulatory Visit | Attending: Obstetrics and Gynecology

## 2017-09-25 ENCOUNTER — Encounter (HOSPITAL_COMMUNITY): Payer: Self-pay | Admitting: Anesthesiology

## 2017-09-25 ENCOUNTER — Inpatient Hospital Stay (HOSPITAL_COMMUNITY)
Admission: RE | Admit: 2017-09-25 | Discharge: 2017-09-25 | Disposition: A | Discharge status: Home / Self Care | Payer: Medicaid Other | Source: Ambulatory Visit | Attending: Obstetrics and Gynecology | Admitting: Obstetrics and Gynecology

## 2017-09-25 SURGERY — Surgical Case
Anesthesia: Spinal

## 2017-09-25 MED ORDER — PRENATAL MULTIVITAMIN CH
1.0000 | ORAL_TABLET | Freq: Every day | ORAL | Status: DC
Start: 2017-09-25 — End: 2017-09-28
  Administered 2017-09-26 – 2017-09-27 (×2): 1 via ORAL
  Filled 2017-09-25 (×2): qty 1

## 2017-09-25 MED ORDER — OXYCODONE-ACETAMINOPHEN 5-325 MG PO TABS
1.0000 | ORAL_TABLET | Freq: Four times a day (QID) | ORAL | Status: DC | PRN
  Administered 2017-09-26 – 2017-09-27 (×4): 2 via ORAL
  Filled 2017-09-25 (×3): qty 2
  Filled 2017-09-25: qty 1
  Filled 2017-09-25: qty 2

## 2017-09-25 MED ORDER — NALOXONE HCL 4 MG/10ML IJ SOLN
1.0000 ug/kg/h | INTRAVENOUS | Status: DC | PRN
  Filled 2017-09-25: qty 5

## 2017-09-25 MED ORDER — KETOROLAC TROMETHAMINE 30 MG/ML IJ SOLN: 30.0000 mg | Freq: Four times a day (QID) | INTRAMUSCULAR | Status: AC | PRN

## 2017-09-25 MED ORDER — NALOXONE HCL 0.4 MG/ML IJ SOLN: 0.4000 mg | INTRAMUSCULAR | Status: DC | PRN

## 2017-09-25 MED ORDER — NALBUPHINE HCL 10 MG/ML IJ SOLN: 5.0000 mg | INTRAMUSCULAR | Status: DC | PRN

## 2017-09-25 MED ORDER — FENTANYL CITRATE (PF) 100 MCG/2ML IJ SOLN
INTRAMUSCULAR | Status: AC
  Filled 2017-09-25: qty 2

## 2017-09-25 MED ORDER — MORPHINE SULFATE (PF) 0.5 MG/ML IJ SOLN
INTRAMUSCULAR | Status: DC | PRN
  Administered 2017-09-25: .2 mg via INTRATHECAL

## 2017-09-25 MED ORDER — BUPIVACAINE IN DEXTROSE 0.75-8.25 % IT SOLN
INTRATHECAL | Status: DC | PRN
  Administered 2017-09-25: 1.6 mL via INTRATHECAL

## 2017-09-25 MED ORDER — OXYCODONE HCL 5 MG PO TABS
10.0000 mg | ORAL_TABLET | ORAL | Status: DC | PRN
  Administered 2017-09-27 – 2017-09-28 (×2): 10 mg via ORAL
  Filled 2017-09-25 (×2): qty 2

## 2017-09-25 MED ORDER — DIBUCAINE 1 % RE OINT: 1.0000 "application " | TOPICAL_OINTMENT | RECTAL | Status: DC | PRN

## 2017-09-25 MED ORDER — SODIUM CHLORIDE 0.9% FLUSH: 3.0000 mL | INTRAVENOUS | Status: DC | PRN

## 2017-09-25 MED ORDER — DIPHENHYDRAMINE HCL 25 MG PO CAPS
25.0000 mg | ORAL_CAPSULE | ORAL | Status: DC | PRN
  Filled 2017-09-25: qty 1

## 2017-09-25 MED ORDER — SCOPOLAMINE 1 MG/3DAYS TD PT72
MEDICATED_PATCH | TRANSDERMAL | Status: DC | PRN
  Administered 2017-09-25: 1 via TRANSDERMAL

## 2017-09-25 MED ORDER — MEPERIDINE HCL 25 MG/ML IJ SOLN
INTRAMUSCULAR | Status: DC | PRN
  Administered 2017-09-25: 25 mg via INTRAVENOUS

## 2017-09-25 MED ORDER — DIPHENHYDRAMINE HCL 50 MG/ML IJ SOLN: 12.5000 mg | INTRAMUSCULAR | Status: DC | PRN

## 2017-09-25 MED ORDER — SIMETHICONE 80 MG PO CHEW
80.0000 mg | CHEWABLE_TABLET | ORAL | Status: DC
  Administered 2017-09-26 – 2017-09-27 (×3): 80 mg via ORAL
  Filled 2017-09-25 (×3): qty 1

## 2017-09-25 MED ORDER — SODIUM CHLORIDE 0.9 % IR SOLN
Status: DC | PRN
  Administered 2017-09-25: 1

## 2017-09-25 MED ORDER — ZOLPIDEM TARTRATE 5 MG PO TABS: 5.0000 mg | ORAL_TABLET | Freq: Every evening | ORAL | Status: DC | PRN

## 2017-09-25 MED ORDER — FENTANYL CITRATE (PF) 100 MCG/2ML IJ SOLN
INTRAMUSCULAR | Status: DC | PRN
  Administered 2017-09-25: 90 ug via INTRAVENOUS
  Administered 2017-09-25: 10 ug via INTRATHECAL

## 2017-09-25 MED ORDER — MENTHOL 3 MG MT LOZG: 1.0000 | LOZENGE | OROMUCOSAL | Status: DC | PRN

## 2017-09-25 MED ORDER — CEFAZOLIN SODIUM-DEXTROSE 2-4 GM/100ML-% IV SOLN
INTRAVENOUS | Status: AC
Start: 2017-09-25 — End: ?
  Filled 2017-09-25: qty 100

## 2017-09-25 MED ORDER — SIMETHICONE 80 MG PO CHEW
80.0000 mg | CHEWABLE_TABLET | Freq: Three times a day (TID) | ORAL | Status: DC
  Administered 2017-09-25 – 2017-09-28 (×6): 80 mg via ORAL
  Filled 2017-09-25 (×6): qty 1

## 2017-09-25 MED ORDER — LACTATED RINGERS IV SOLN
INTRAVENOUS | Status: DC
  Administered 2017-09-25: 12:00:00 via INTRAVENOUS

## 2017-09-25 MED ORDER — NALBUPHINE HCL 10 MG/ML IJ SOLN: 5.0000 mg | Freq: Once | INTRAMUSCULAR | Status: DC | PRN

## 2017-09-25 MED ORDER — SIMETHICONE 80 MG PO CHEW: 80.0000 mg | CHEWABLE_TABLET | ORAL | Status: DC | PRN

## 2017-09-25 MED ORDER — ONDANSETRON HCL 4 MG/2ML IJ SOLN
INTRAMUSCULAR | Status: DC | PRN
  Administered 2017-09-25: 4 mg via INTRAVENOUS

## 2017-09-25 MED ORDER — DIPHENHYDRAMINE HCL 25 MG PO CAPS
25.0000 mg | ORAL_CAPSULE | Freq: Four times a day (QID) | ORAL | Status: DC | PRN
  Administered 2017-09-25: 25 mg via ORAL
  Filled 2017-09-25 (×2): qty 1

## 2017-09-25 MED ORDER — SENNOSIDES-DOCUSATE SODIUM 8.6-50 MG PO TABS
2.0000 | ORAL_TABLET | ORAL | Status: DC
  Administered 2017-09-26 – 2017-09-27 (×3): 2 via ORAL
  Filled 2017-09-25 (×3): qty 2

## 2017-09-25 MED ORDER — ONDANSETRON HCL 4 MG/2ML IJ SOLN: 4.0000 mg | Freq: Three times a day (TID) | INTRAMUSCULAR | Status: DC | PRN

## 2017-09-25 MED ORDER — FENTANYL CITRATE (PF) 100 MCG/2ML IJ SOLN
25.0000 ug | INTRAMUSCULAR | Status: DC | PRN
  Administered 2017-09-25: 25 ug via INTRAVENOUS
  Administered 2017-09-25: 50 ug via INTRAVENOUS
  Administered 2017-09-25: 25 ug via INTRAVENOUS

## 2017-09-25 MED ORDER — OXYTOCIN 10 UNIT/ML IJ SOLN
INTRAMUSCULAR | Status: DC | PRN
  Administered 2017-09-25: 40 [IU] via INTRAVENOUS

## 2017-09-25 MED ORDER — SCOPOLAMINE 1 MG/3DAYS TD PT72
1.0000 | MEDICATED_PATCH | Freq: Once | TRANSDERMAL | Status: DC
  Filled 2017-09-25: qty 1

## 2017-09-25 MED ORDER — DEXAMETHASONE SODIUM PHOSPHATE 4 MG/ML IJ SOLN
INTRAMUSCULAR | Status: DC | PRN
  Administered 2017-09-25: 4 mg via INTRAVENOUS

## 2017-09-25 MED ORDER — MEPERIDINE HCL 25 MG/ML IJ SOLN: 6.2500 mg | INTRAMUSCULAR | Status: DC | PRN

## 2017-09-25 MED ORDER — LACTATED RINGERS IV SOLN
INTRAVENOUS | Status: DC | PRN
  Administered 2017-09-25: 07:00:00 via INTRAVENOUS

## 2017-09-25 MED ORDER — OXYCODONE HCL 5 MG PO TABS
5.0000 mg | ORAL_TABLET | ORAL | Status: DC | PRN
  Administered 2017-09-25 – 2017-09-28 (×3): 5 mg via ORAL
  Filled 2017-09-25 (×3): qty 1

## 2017-09-25 MED ORDER — PROMETHAZINE HCL 25 MG/ML IJ SOLN
6.2500 mg | INTRAMUSCULAR | Status: DC | PRN
Start: 2017-09-25 — End: 2017-09-25

## 2017-09-25 MED ORDER — PHENYLEPHRINE 40 MCG/ML (10ML) SYRINGE FOR IV PUSH (FOR BLOOD PRESSURE SUPPORT)
PREFILLED_SYRINGE | INTRAVENOUS | Status: AC
  Filled 2017-09-25: qty 10

## 2017-09-25 MED ORDER — ACETAMINOPHEN 500 MG PO TABS
1000.0000 mg | ORAL_TABLET | Freq: Four times a day (QID) | ORAL | Status: AC
  Administered 2017-09-25 – 2017-09-26 (×3): 1000 mg via ORAL
  Filled 2017-09-25 (×3): qty 2

## 2017-09-25 MED ORDER — ACETAMINOPHEN 325 MG PO TABS
650.0000 mg | ORAL_TABLET | ORAL | Status: DC | PRN
  Administered 2017-09-26 – 2017-09-28 (×2): 650 mg via ORAL
  Filled 2017-09-25 (×2): qty 2

## 2017-09-25 MED ORDER — KETOROLAC TROMETHAMINE 30 MG/ML IJ SOLN
INTRAMUSCULAR | Status: DC | PRN
  Administered 2017-09-25: 30 mg via INTRAVENOUS

## 2017-09-25 MED ORDER — PHENYLEPHRINE 8 MG IN D5W 100 ML (0.08MG/ML) PREMIX OPTIME
INJECTION | INTRAVENOUS | Status: AC
  Filled 2017-09-25: qty 100

## 2017-09-25 MED ORDER — SCOPOLAMINE 1 MG/3DAYS TD PT72
MEDICATED_PATCH | TRANSDERMAL | Status: AC
  Filled 2017-09-25: qty 1

## 2017-09-25 MED ORDER — DEXAMETHASONE SODIUM PHOSPHATE 4 MG/ML IJ SOLN
INTRAMUSCULAR | Status: AC
  Filled 2017-09-25: qty 1

## 2017-09-25 MED ORDER — ONDANSETRON HCL 4 MG/2ML IJ SOLN
INTRAMUSCULAR | Status: AC
  Filled 2017-09-25: qty 2

## 2017-09-25 MED ORDER — OXYTOCIN 10 UNIT/ML IJ SOLN
INTRAMUSCULAR | Status: AC
  Filled 2017-09-25: qty 4

## 2017-09-25 MED ORDER — PHENYLEPHRINE 8 MG IN D5W 100 ML (0.08MG/ML) PREMIX OPTIME
INJECTION | INTRAVENOUS | Status: DC | PRN
  Administered 2017-09-25: 60 ug/min via INTRAVENOUS

## 2017-09-25 MED ORDER — MORPHINE SULFATE (PF) 0.5 MG/ML IJ SOLN
INTRAMUSCULAR | Status: AC
  Filled 2017-09-25: qty 10

## 2017-09-25 MED ORDER — IBUPROFEN 600 MG PO TABS
600.0000 mg | ORAL_TABLET | Freq: Four times a day (QID) | ORAL | Status: DC
  Administered 2017-09-25 – 2017-09-28 (×11): 600 mg via ORAL
  Filled 2017-09-25 (×12): qty 1

## 2017-09-25 MED ORDER — MEPERIDINE HCL 25 MG/ML IJ SOLN
INTRAMUSCULAR | Status: AC
  Filled 2017-09-25: qty 1

## 2017-09-25 MED ORDER — COCONUT OIL OIL: 1.0000 "application " | TOPICAL_OIL | Status: DC | PRN

## 2017-09-25 MED ORDER — OXYTOCIN 40 UNITS IN LACTATED RINGERS INFUSION - SIMPLE MED: 2.5000 [IU]/h | INTRAVENOUS | Status: AC

## 2017-09-25 MED ORDER — WITCH HAZEL-GLYCERIN EX PADS: 1.0000 "application " | MEDICATED_PAD | CUTANEOUS | Status: DC | PRN

## 2017-09-25 SURGICAL SUPPLY — 39 items
APL SKNCLS STERI-STRIP NONHPOA (GAUZE/BANDAGES/DRESSINGS) ×1
BENZOIN TINCTURE PRP APPL 2/3 (GAUZE/BANDAGES/DRESSINGS) ×3 IMPLANT
CHLORAPREP W/TINT 26ML (MISCELLANEOUS) ×3 IMPLANT
CLAMP CORD UMBIL (MISCELLANEOUS) IMPLANT
CLOSURE STERI STRIP 1/2 X4 (GAUZE/BANDAGES/DRESSINGS) ×2 IMPLANT
CLOTH BEACON ORANGE TIMEOUT ST (SAFETY) ×3 IMPLANT
DRAIN JACKSON PRT FLT 10 (DRAIN) IMPLANT
DRSG OPSITE POSTOP 4X10 (GAUZE/BANDAGES/DRESSINGS) ×3 IMPLANT
ELECT REM PT RETURN 9FT ADLT (ELECTROSURGICAL) ×3
ELECTRODE REM PT RTRN 9FT ADLT (ELECTROSURGICAL) ×1 IMPLANT
EVACUATOR SILICONE 100CC (DRAIN) IMPLANT
EXTRACTOR VACUUM M CUP 4 TUBE (SUCTIONS) ×1 IMPLANT
EXTRACTOR VACUUM M CUP 4' TUBE (SUCTIONS) ×1
GLOVE BIO SURGEON STRL SZ 6.5 (GLOVE) ×2 IMPLANT
GLOVE BIO SURGEONS STRL SZ 6.5 (GLOVE) ×1
GLOVE BIOGEL PI IND STRL 7.0 (GLOVE) ×2 IMPLANT
GLOVE BIOGEL PI INDICATOR 7.0 (GLOVE) ×4
GOWN STRL REUS W/TWL LRG LVL3 (GOWN DISPOSABLE) ×6 IMPLANT
HEMOSTAT SURGICEL 4X8 (HEMOSTASIS) ×2 IMPLANT
KIT ABG SYR 3ML LUER SLIP (SYRINGE) IMPLANT
NDL HYPO 25X5/8 SAFETYGLIDE (NEEDLE) IMPLANT
NEEDLE HYPO 25X5/8 SAFETYGLIDE (NEEDLE) IMPLANT
NS IRRIG 1000ML POUR BTL (IV SOLUTION) ×3 IMPLANT
PACK C SECTION WH (CUSTOM PROCEDURE TRAY) ×3 IMPLANT
PAD OB MATERNITY 4.3X12.25 (PERSONAL CARE ITEMS) ×3 IMPLANT
PENCIL SMOKE EVAC W/HOLSTER (ELECTROSURGICAL) ×3 IMPLANT
RTRCTR C-SECT PINK 25CM LRG (MISCELLANEOUS) ×2 IMPLANT
STRIP CLOSURE SKIN 1/2X4 (GAUZE/BANDAGES/DRESSINGS) ×2 IMPLANT
SUT CHROMIC 0 CT 1 (SUTURE) ×3 IMPLANT
SUT MNCRL AB 3-0 PS2 27 (SUTURE) ×3 IMPLANT
SUT PLAIN 2 0 (SUTURE) ×6
SUT PLAIN 2 0 XLH (SUTURE) ×3 IMPLANT
SUT PLAIN ABS 2-0 CT1 27XMFL (SUTURE) ×2 IMPLANT
SUT SILK 2 0 SH (SUTURE) IMPLANT
SUT VIC AB 0 CTX 36 (SUTURE) ×12
SUT VIC AB 0 CTX36XBRD ANBCTRL (SUTURE) ×4 IMPLANT
TOWEL OR 17X24 6PK STRL BLUE (TOWEL DISPOSABLE) ×3 IMPLANT
TRAY FOLEY W/BAG SLVR 14FR LF (SET/KITS/TRAYS/PACK) ×3 IMPLANT
WATER STERILE IRR 1000ML POUR (IV SOLUTION) ×2 IMPLANT

## 2017-09-25 NOTE — Lactation Note (Signed)
This note was copied from a baby's chart. Lactation Consultation Note  Patient Name: Vickie Norris ZOXWR'U Date: 09/25/2017 Reason for consult: Follow-up assessment Mom called out for latch assist.  Baby showing feeding cues.  Placed skin to skin in both football and cradle hold.  Mom has short nipples and breast semi compressible.  Hand expression done but no colostrum seen.  Baby sleepy and not opening mouth.  Baby placed skin to skin on mother's chest.   Maternal Data Has patient been taught Hand Expression?: Yes Does the patient have breastfeeding experience prior to this delivery?: No  Feeding Feeding Type: Breast Fed  LATCH Score Latch: Too sleepy or reluctant, no latch achieved, no sucking elicited.  Audible Swallowing: None  Type of Nipple: Flat  Comfort (Breast/Nipple): Soft / non-tender  Hold (Positioning): Assistance needed to correctly position infant at breast and maintain latch.  LATCH Score: 4  Interventions Interventions: Assisted with latch;Breast compression;Skin to skin;Adjust position;Support pillows;Breast massage;Hand express;Position options  Lactation Tools Discussed/Used     Consult Status Consult Status: Follow-up Date: 09/19/17 Follow-up type: In-patient    Huston Foley 09/25/2017, 2:57 PM

## 2017-09-25 NOTE — Anesthesia Postprocedure Evaluation (Signed)
Anesthesia Post Note  Patient: Vickie Norris  Procedure(s) Performed: CESAREAN SECTION (N/A )     Patient location during evaluation: Mother Baby Anesthesia Type: Spinal Level of consciousness: awake and alert and oriented Pain management: satisfactory to patient Vital Signs Assessment: post-procedure vital signs reviewed and stable Respiratory status: spontaneous breathing and nonlabored ventilation Cardiovascular status: stable Postop Assessment: no headache, no backache, patient able to bend at knees, no signs of nausea or vomiting and adequate PO intake Anesthetic complications: no    Last Vitals:  Vitals:   09/25/17 1057 09/25/17 1215  BP: (!) 100/53 107/63  Pulse: 69 75  Resp: 18 20  Temp: 36.9 C 37.1 C  SpO2: 94% 97%    Last Pain:  Vitals:   09/25/17 1215  TempSrc: Oral  PainSc:    Pain Goal: Patients Stated Pain Goal: 2 (09/24/17 0351)               Madison Hickman

## 2017-09-25 NOTE — Lactation Note (Signed)
This note was copied from a baby's chart. Lactation Consultation Note  Patient Name: Vickie Norris ZOXWR'U Date: 09/25/2017 Reason for consult: Initial assessment;Primapara;Term Breastfeeding consultation services and support information given to patient.  This is mom's first baby and newborn is 13 hours old.  Mom has attempted to latch baby but he is very sleepy.  Baby currently in crib sleeping.  Instructed mom to watch for feeding cues and call for assist prn.  Skin to skin encouraged.  Maternal Data    Feeding    LATCH Score                   Interventions    Lactation Tools Discussed/Used     Consult Status Consult Status: Follow-up Date: 09/26/17 Follow-up type: In-patient    Huston Foley 09/25/2017, 2:33 PM

## 2017-09-25 NOTE — Anesthesia Procedure Notes (Signed)
Spinal  Patient location during procedure: OR Start time: 09/25/2017 6:04 AM End time: 09/25/2017 6:07 AM Staffing Anesthesiologist: Cecile Hearing, MD Performed: anesthesiologist  Preanesthetic Checklist Completed: patient identified, surgical consent, pre-op evaluation, timeout performed, IV checked, risks and benefits discussed and monitors and equipment checked Spinal Block Patient position: sitting Prep: site prepped and draped and DuraPrep Patient monitoring: continuous pulse ox and blood pressure Approach: midline Location: L3-4 Injection technique: single-shot Needle Needle type: Pencan  Needle gauge: 24 G Assessment Sensory level: T6 Additional Notes Functioning IV was confirmed and monitors were applied. Sterile prep and drape, including hand hygiene, mask and sterile gloves were used. The patient was positioned and the spine was prepped. The skin was anesthetized with lidocaine.  Free flow of clear CSF was obtained prior to injecting local anesthetic into the CSF.  The spinal needle aspirated freely following injection.  The needle was carefully withdrawn.  The patient tolerated the procedure well. Consent was obtained prior to procedure with all questions answered and concerns addressed. Risks including but not limited to bleeding, infection, nerve damage, paralysis, failed block, inadequate analgesia, allergic reaction, high spinal, itching and headache were discussed and the patient wished to proceed.   Arrie Aran, MD

## 2017-09-25 NOTE — Anesthesia Postprocedure Evaluation (Signed)
Anesthesia Post Note  Patient: Vickie Norris  Procedure(s) Performed: CESAREAN SECTION (N/A )     Patient location during evaluation: PACU Anesthesia Type: Spinal Level of consciousness: awake and alert Pain management: pain level controlled Vital Signs Assessment: post-procedure vital signs reviewed and stable Respiratory status: spontaneous breathing and respiratory function stable Cardiovascular status: blood pressure returned to baseline and stable Postop Assessment: spinal receding Anesthetic complications: no    Last Vitals:  Vitals:   09/25/17 0841 09/25/17 0942  BP: 115/64 106/62  Pulse: 80 72  Resp:  17  Temp: 36.7 C   SpO2: 93% 94%    Last Pain:  Vitals:   09/25/17 0942  TempSrc:   PainSc: 10-Worst pain ever   Pain Goal: Patients Stated Pain Goal: 2 (09/24/17 0351)               Kennieth Rad

## 2017-09-25 NOTE — Plan of Care (Signed)
Patient progressed well through shift during first 12hrs post op. Will continue to monitor. Newman Pies, RN

## 2017-09-25 NOTE — Addendum Note (Signed)
Addendum  created 09/25/17 1339 by Shanon Payor, CRNA   Charge Capture section accepted, Sign clinical note

## 2017-09-25 NOTE — Transfer of Care (Signed)
Immediate Anesthesia Transfer of Care Note  Patient: Vickie Norris  Procedure(s) Performed: CESAREAN SECTION (N/A )  Patient Location: PACU  Anesthesia Type:Spinal  Level of Consciousness: awake, alert  and oriented  Airway & Oxygen Therapy: Patient Spontanous Breathing  Post-op Assessment: Report given to RN and Post -op Vital signs reviewed and stable  Post vital signs: Reviewed and stable  Last Vitals:  Vitals Value Taken Time  BP 134/84 09/25/2017  7:30 AM  Temp    Pulse 86 09/25/2017  7:38 AM  Resp 17 09/25/2017  7:38 AM  SpO2 97 % 09/25/2017  7:38 AM  Vitals shown include unvalidated device data.  Last Pain:  Vitals:   09/25/17 0730  TempSrc: (P) Oral  PainSc:       Patients Stated Pain Goal: 2 (09/24/17 0351)  Complications: No apparent anesthesia complications

## 2017-09-25 NOTE — Anesthesia Preprocedure Evaluation (Signed)
Anesthesia Evaluation  Patient identified by MRN, date of birth, ID band Patient awake    Reviewed: Allergy & Precautions, NPO status , Patient's Chart, lab work & pertinent test results  Airway Mallampati: II  TM Distance: >3 FB Neck ROM: Full    Dental  (+) Teeth Intact, Dental Advisory Given   Pulmonary neg pulmonary ROS,    Pulmonary exam normal breath sounds clear to auscultation       Cardiovascular negative cardio ROS Normal cardiovascular exam Rhythm:Regular Rate:Normal     Neuro/Psych negative neurological ROS     GI/Hepatic Neg liver ROS, GERD  ,  Endo/Other  negative endocrine ROSObesity   Renal/GU negative Renal ROS     Musculoskeletal negative musculoskeletal ROS (+)   Abdominal   Peds  Hematology  (+) Blood dyscrasia, anemia , Plt 243k   Anesthesia Other Findings Day of surgery medications reviewed with the patient.  Reproductive/Obstetrics (+) Pregnancy                            Anesthesia Physical Anesthesia Plan  ASA: II and emergent  Anesthesia Plan: Spinal   Post-op Pain Management:    Induction:   PONV Risk Score and Plan: 2 and Dexamethasone, Ondansetron, Scopolamine patch - Pre-op and Treatment may vary due to age or medical condition  Airway Management Planned: Natural Airway  Additional Equipment:   Intra-op Plan:   Post-operative Plan:   Informed Consent: I have reviewed the patients History and Physical, chart, labs and discussed the procedure including the risks, benefits and alternatives for the proposed anesthesia with the patient or authorized representative who has indicated his/her understanding and acceptance.   Dental advisory given  Plan Discussed with: CRNA, Anesthesiologist and Surgeon  Anesthesia Plan Comments: (Emergent C-section for failure to progress.)        Anesthesia Quick Evaluation

## 2017-09-25 NOTE — Progress Notes (Signed)
Dr. Normand Sloop contacted via phone regarding patient complaint of severe pain. Rating at worst ever. Informed MD of patients recent medication administrations. Verbal order of Percocet 5-325mg  q6hrs PRN. Will continue to monitor. Newman Pies, RN

## 2017-09-26 LAB — CBC
HEMATOCRIT: 24.9 % — AB (ref 36.0–46.0)
Hemoglobin: 8.2 g/dL — ABNORMAL LOW (ref 12.0–15.0)
MCH: 30.8 pg (ref 26.0–34.0)
MCHC: 32.9 g/dL (ref 30.0–36.0)
MCV: 93.6 fL (ref 78.0–100.0)
Platelets: 160 10*3/uL (ref 150–400)
RBC: 2.66 MIL/uL — AB (ref 3.87–5.11)
RDW: 14.8 % (ref 11.5–15.5)
WBC: 15.3 10*3/uL — AB (ref 4.0–10.5)

## 2017-09-26 NOTE — Progress Notes (Addendum)
Subjective: Postpartum Day 1. Cesarean Delivery Patient reports incisional pain, tolerating PO and no problems voiding. No flatus yet. Asymptomatic of anemia.   Objective: Vital signs in last 24 hours: Vitals:   09/25/17 2101 09/26/17 0049 09/26/17 0430 09/26/17 0601  BP: (!) 113/52 (!) 105/57 102/60 101/60  Pulse: 89 89 88 80  Resp: Temp: 98.4 F (36.9 C) 98.2 F (36.8 C)  98.3 F (36.8 C)  TempSrc: Oral     SpO2: 99%     Weight:      Height:       Results for orders placed or performed during the hospital encounter of 09/24/17 (from the past 24 hour(s))  CBC     Status: Abnormal   Collection Time: 09/26/17  5:18 AM  Result Value Ref Range   WBC 15.3 (H) 4.0 - 10.5 K/uL   RBC 2.66 (L) 3.87 - 5.11 MIL/uL   Hemoglobin 8.2 (L) 12.0 - 15.0 g/dL   HCT 11.9 (L) 14.7 - 82.9 %   MCV 93.6 78.0 - 100.0 fL   MCH 30.8 26.0 - 34.0 pg   MCHC 32.9 30.0 - 36.0 g/dL   RDW 56.2 13.0 - 86.5 %   Platelets 160 150 - 400 K/uL    Physical Exam:  General: alert and cooperative Lochia: appropriate Uterine Fundus: firm Incision: healing well, no significant drainage, no dehiscence, no significant erythema DVT Evaluation: No evidence of DVT seen on physical exam. Negative Homan's sign. No cords or calf tenderness. No significant calf/ankle edema.    Assessment/Plan: Status post Cesarean section. Doing well postoperatively.  Continue current care. Discharge with Iron supplement   Vickie Norris 09/26/2017, 11:33 AM

## 2017-09-26 NOTE — Lactation Note (Addendum)
This note was copied from a baby's chart. Lactation Consultation Note: Mother has been breastfeeding with cues . Mother is using a #20 nipple shield. Mother denies seeing any colostrum in the shield.  Mother taught to hand express colostrum and to firm nipple with her fingers.  Assistance given with properly positioning infant in football hold. Infant grasp the bare breast with some adjustment of the upper lip and chin tug. Infant sustained latch for 15 mins. Observed swallows. Mother complaints of slight discomfort on the initial latch. Mother reports that this is the first time infant has latched this well. Advised mother to use nipple shield if unable to latch infant to the bare breast.  Reviewed importance of proper application of the nipple shield. Advised mother to always check to see if colostrum is present when used.  Mother was sat up with DEBP. Staff nurse to assist with first pumping. Advised mother to hand express and pump every 2-3 hours for 15 mins.  Informed mother that infant needs addition calories if not latching well to the nipple shield. Mother was given supplemental guidelines for ebm/formula as needed.  Discussed cluster feeding and encouraged mother to do frequent skin to skin and watch for infants feeding cues.   Mother receptive to all teaching and plan of care.   Patient Name: Vickie Norris ZOXWR'U Date: 09/26/2017 Reason for consult: Follow-up assessment   Maternal Data    Feeding Feeding Type: Breast Fed Length of feed: 15 min(infant latched to bare breast with a few swallows observed)  LATCH Score Latch: Repeated attempts needed to sustain latch, nipple held in mouth throughout feeding, stimulation needed to elicit sucking reflex.  Audible Swallowing: A few with stimulation  Type of Nipple: Everted at rest and after stimulation  Comfort (Breast/Nipple): Soft / non-tender  Hold (Positioning): Assistance needed to correctly position infant at breast  and maintain latch.  LATCH Score: 7  Interventions Interventions: Assisted with latch;Skin to skin;Hand express;Breast compression;Adjust position;Support pillows;Position options;Expressed milk  Lactation Tools Discussed/Used Tools: Nipple Shields Nipple shield size: 20   Consult Status Consult Status: Follow-up Date: 09/27/17 Follow-up type: In-patient    Vickie Norris [facility] 09/26/2017, 3:56 PM

## 2017-09-26 NOTE — Lactation Note (Signed)
This note was copied from a baby's chart. Lactation Consultation Note Baby 22 hrs old. Baby hasn't been BF well.   Mom has large heavy breast. LC feels breast is heavy from edema. No pitting noted. Very full feeling.  Only slightly compressible, but baby not able to obtain deep latch w/o NS.  LC feels that if breast softer baby will be able to hopefully obtain latch w/o NS.  Encouraged pre-pump prior to latch w/hand pump.  Hand expression w/no colostrum noted.  FOB at bedside assisting. Encouraged FOB to adjust baby's mouth widening flange. Baby had tight latch. Needed upper and lower lips flanged.  Newborn behavior, STS, I&O, cluster feeding, supply and demand. Encouraged "C" hold when latching. Mom has #20 NS given by RN. Encouraged to pump for stimulation. Mom concerned when to supplement w/formula. Discussed what is monitored. Mom encouraged to feed baby 8-12 times/24 hours and with feeding cues. Call for assistance or questions or assistance. WH/LC brochure given w/resources, support groups and LC services. Patient Name: Vickie Norris Date: 09/26/2017 Reason for consult: Follow-up assessment;Difficult latch   Maternal Data    Feeding Feeding Type: Breast Fed Length of feed: 15 min(still BF)  LATCH Score Latch: Repeated attempts needed to sustain latch, nipple held in mouth throughout feeding, stimulation needed to elicit sucking reflex.  Audible Swallowing: None  Type of Nipple: Everted at rest and after stimulation(short shaft)  Comfort (Breast/Nipple): Filling, red/small blisters or bruises, mild/mod discomfort(heavy)  Hold (Positioning): Full assist, staff holds infant at breast  LATCH Score: 4  Interventions Interventions: Breast feeding basics reviewed;Adjust position;Assisted with latch;Support pillows;Skin to skin;Breast massage;Position options;Hand express;Breast compression;Pre-pump if needed  Lactation Tools Discussed/Used Nipple shield size:  20   Consult Status Consult Status: Follow-up Date: 09/26/17 Follow-up type: In-patient    Fredrik Mogel, Diamond Nickel 09/26/2017, 5:03 AM

## 2017-09-26 NOTE — Plan of Care (Signed)
Patient progressing well through shift on routine care. Will continue to monitor. Newman Pies, RN

## 2017-09-27 NOTE — Lactation Note (Signed)
This note was copied from a baby's chart. Lactation Consultation Note  Patient Name: Vickie Norris Date: 09/27/2017    [facility] Follow Up Visit:  RN request to visit family for an alternative way to supplement infant.  FOB feeding infant with a curved tip syringe.  Offered parents an alternative option for feeding baby that would be easier now that his volumes will increase and be 18-25 mls after every breastfeed.  Parents open for suggestions.  I demonstrated the cup feeding and explained process of feeding.  Allowed parents to observe me feeding infant and then let FOB participate by using the cup.  Father stated that he enjoyed the cup feeding more than the curved tip syringe.  Infant had no difficulty adjusting to the cup feeding and consumed a total of 30 mls with the syringe and cup volumes combined.    Infant quiet and content after feeding and placed back in bassinet per parents request.  Will call as needed for assistance.  RN in room and updated.               Khelani Kops R Berton Butrick 09/27/2017, 6:24 AM

## 2017-09-27 NOTE — Progress Notes (Signed)
Subjective: Postpartum Day 2. Cesarean Delivery Patient reports incisional pain, tolerating PO, + flatus and no problems voiding. No BM yet but taking stool softeners. Asymptomatic of anemia. Complaining of occasional, intermittent chest tightness while breastfeeding that is non painful. Denies shortness of breath or chest pain. Undecided if she will leave today or tomorrow.   Objective: Vital signs in last 24 hours: Vitals:   09/26/17 0430 09/26/17 0601 09/26/17 1808 09/27/17 0616  BP: 102/60 101/60 110/66 104/67  Pulse: 88 80 91 89  Resp: Temp:  98.3 F (36.8 C) 98.7 F (37.1 C) 98.4 F (36.9 C)  TempSrc:   Oral Oral  SpO2:    100%  Weight:      Height:       Results for orders placed or performed during the hospital encounter of 09/24/17 (from the past 48 hour(s))  CBC     Status: Abnormal   Collection Time: 09/26/17  5:18 AM  Result Value Ref Range   WBC 15.3 (H) 4.0 - 10.5 K/uL   RBC 2.66 (L) 3.87 - 5.11 MIL/uL   Hemoglobin 8.2 (L) 12.0 - 15.0 g/dL   HCT 16.1 (L) 09.6 - 04.5 %   MCV 93.6 78.0 - 100.0 fL   MCH 30.8 26.0 - 34.0 pg   MCHC 32.9 30.0 - 36.0 g/dL   RDW 40.9 81.1 - 91.4 %   Platelets 160 150 - 400 K/uL    Comment: Performed at [facility], [address] [place], [place] [address]    Physical Exam:  General: alert and cooperative Lochia: appropriate Uterine Fundus: firm Incision: healing well, no significant drainage, no dehiscence, no significant erythema DVT Evaluation: No evidence of DVT seen on physical exam. Negative Homan's sign. No cords or calf tenderness. No significant calf/ankle edema.  Lung sounds clear bilaterally, no increased work of breathing, oxygen saturation 100%, heart sounds audible and regular. Bowel sounds audible in all 4 quadrants.   Assessment/Plan: Status post Cesarean section. Doing well postoperatively.  Continue current care Discussed the importance of getting out of bed and moving around to help with gas  symptoms  Discharge with Iron supplement   Vickie Norris 09/27/2017, 10:44 AM

## 2017-09-27 NOTE — Progress Notes (Signed)
Pt c/o intermittent sharp pressure in chest, for last couple days since delivery. Lungs sound clear, O2 sats 100 last assessment, active bowel sounds. Midwife Rennis Harding contacted

## 2017-09-27 NOTE — Lactation Note (Signed)
This note was copied from a baby's chart. Lactation Consultation Note  Patient Name: Vickie Norris [id number] Date: 09/27/2017 Reason for consult: Primapara  Mom says that feedings are going better. Consult interrupted by arrival of new visitor. Mom has my # to call when ready for continuation of consult.  Lurline Hare [facility] 09/27/2017, 5:44 PM

## 2017-09-28 MED ORDER — OXYCODONE-ACETAMINOPHEN 5-325 MG PO TABS: 1.0000 | ORAL_TABLET | Freq: Four times a day (QID) | ORAL | 0 refills | Status: AC | PRN

## 2017-09-28 MED ORDER — IBUPROFEN 600 MG PO TABS: 600.0000 mg | ORAL_TABLET | Freq: Four times a day (QID) | ORAL | 1 refills | Status: DC | PRN

## 2017-09-28 NOTE — Lactation Note (Signed)
This note was copied from a baby's chart. Lactation Consultation Note:  Mother reports that she is breastfeeding infant better. She reports that she has a #20 and #24 nipple shield. She has been using both. Mother reports that infant is latching deeper and she is observing swallows and milk in the shield. Mother has post pumped several times.  She has been giving ebm and formula at times with a bottle. Parents were taught paced bottle feeding. Advised to get a wide base bottle nipple.  Discussed pumping more frequently if she is desiring to do bottles. Discussed importance of getting milk to come to volume with lots of cue base feeding.  Discussed tips on soothing infant . Discussed cluster feeding . Encouraged mother to allow infant to attempt to latch on the bare breast and pre-pump if needed. Discussed treatment and prevention of engorgement. Mother very excited that she is making milk. Encouraged to continue to do frequent skin to skin and become familiar with all infants feeding cues.  Advised to follow up at [facility] for pre and post weight assessment. Discussed that a nipple shield can be a barrier to a good supply. Advised mother to post pump at least 4 times daily to protect her milk supply. Mother is aware of available LC services and has phone number to office.   Patient Name: Boy Kendel Pesnell [id number] Date: 09/28/2017 Reason for consult: Follow-up assessment   Maternal Data    Feeding Feeding Type: Formula Length of feed: 30 min  LATCH Score                   Interventions Interventions: Breast massage;Hand express;Reverse pressure;Breast compression;Expressed milk;Hand pump;DEBP;Ice  Lactation Tools Discussed/Used Tools: Nipple Dorris Carnes   Consult Status Consult Status: Complete    Michel Bickers 09/28/2017, 10:21 AM

## 2017-09-28 NOTE — Discharge Summary (Signed)
OB Discharge Summary     Patient Name: Vickie Norris DOB: 07/06/1985 MRN: [id number]  Date of admission: 09/24/2017 Delivering MD: Jaymes Graff   Date of discharge: 09/28/2017  Admitting diagnosis: PREG Intrauterine pregnancy: [redacted]w[redacted]d     Secondary diagnosis:  Active Problems:   Premature rupture of membranes  Additional problems: None     Discharge diagnosis: Term Pregnancy Delivered                                                                                                Post partum procedures:None  Augmentation: NA  Complications: None  Hospital course:  Onset of Labor With Unplanned C/S  32 y.o. yo G1P1001 at [redacted]w[redacted]d was admitted in Latent Labor on 09/24/2017. Patient had a labor course significant for nonreassuring fetal hear rate . Membrane Rupture Time/Date: 12:30 AM ,09/24/2017   The patient went for cesarean section due to Non-Reassuring FHR, and delivered a Viable infant,09/25/2017  Details of operation can be found in separate operative note. Patient had an uncomplicated postpartum course.  She is ambulating,tolerating a regular diet, passing flatus, and urinating well.  Patient is discharged home in stable condition 09/28/17.  Physical exam  Vitals:   09/26/17 1808 09/27/17 0616 09/27/17 1900 09/28/17 0330  BP: 110/66 104/67 120/73 103/64  Pulse: 91 89 69 96  Resp: Temp: 98.7 F (37.1 C) 98.4 F (36.9 C) 98.6 F (37 C) 98.1 F (36.7 C)  TempSrc: Oral Oral Oral Oral  SpO2:  100% 100%   Weight:      Height:       General: alert, cooperative and no distress Lochia: appropriate Uterine Fundus: firm Incision: Dressing is clean, dry, and intact DVT Evaluation: No evidence of DVT seen on physical exam. Labs: Lab Results  Component Value Date   WBC 15.3 (H) 09/26/2017   HGB 8.2 (L) 09/26/2017   HCT 24.9 (L) 09/26/2017   MCV 93.6 09/26/2017   PLT 160 09/26/2017   No flowsheet data found.  Discharge instruction: per After Visit Summary  and "Baby and Me Booklet".  After visit meds:  Allergies as of 09/28/2017   No Known Allergies     Medication List    STOP taking these medications   diphenhydramine-acetaminophen 25-500 MG Tabs tablet Commonly known as:  TYLENOL PM   tinidazole 500 MG tablet Commonly known as:  TINDAMAX     TAKE these medications   ibuprofen 600 MG tablet Commonly known as:  ADVIL,MOTRIN Take 1 tablet (600 mg total) by mouth every 6 (six) hours as needed.   multivitamin-prenatal 27-0.8 MG Tabs tablet Take 1 tablet by mouth daily at 12 noon.   oxyCODONE-acetaminophen 5-325 MG tablet Commonly known as:  PERCOCET/ROXICET Take 1-2 tablets by mouth every 6 (six) hours as needed for up to 7 days for severe pain.       Diet: routine diet  Activity: Advance as tolerated. Pelvic rest for 6 weeks.   Outpatient follow up:2 weeks Follow up Appt:No future appointments. Follow up Visit:No follow-ups on file.  Postpartum contraception: Not Discussed  Newborn Data:  Live born female  Birth Weight: 7 lb 10.2 oz (3465 g) APGAR: 8, 9  Newborn Delivery   Birth date/time:  09/25/2017 06:34:00 Delivery type:  C-Section, Vacuum Assisted Trial of labor:  No C-section categorization:  Primary     Baby Feeding: Breast Disposition:home with mother   09/28/2029 Jessee Avers., MD

## 2018-11-04 ENCOUNTER — Other Ambulatory Visit: Payer: Self-pay | Admitting: *Deleted

## 2018-11-04 DIAGNOSIS — Z20822 Contact with and (suspected) exposure to covid-19: Secondary | ICD-10-CM

## 2018-11-10 LAB — NOVEL CORONAVIRUS, NAA: SARS-CoV-2, NAA: NOT DETECTED

## 2020-05-17 ENCOUNTER — Other Ambulatory Visit: Payer: Medicaid Other

## 2020-05-17 ENCOUNTER — Other Ambulatory Visit: Payer: Self-pay

## 2020-05-17 DIAGNOSIS — Z20822 Contact with and (suspected) exposure to covid-19: Secondary | ICD-10-CM

## 2020-05-21 LAB — SPECIMEN STATUS REPORT

## 2020-05-21 LAB — NOVEL CORONAVIRUS, NAA: SARS-CoV-2, NAA: DETECTED — AB

## 2020-05-23 ENCOUNTER — Encounter (HOSPITAL_COMMUNITY): Payer: Self-pay | Admitting: Obstetrics and Gynecology

## 2020-05-23 ENCOUNTER — Other Ambulatory Visit: Payer: Self-pay

## 2020-05-23 ENCOUNTER — Inpatient Hospital Stay (HOSPITAL_COMMUNITY)
Admission: AD | Admit: 2020-05-23 | Discharge: 2020-05-23 | Disposition: A | Discharge status: Home / Self Care | Payer: BC Managed Care – PPO | Attending: Obstetrics and Gynecology | Admitting: Obstetrics and Gynecology

## 2020-05-23 DIAGNOSIS — O99891 Other specified diseases and conditions complicating pregnancy: Secondary | ICD-10-CM | POA: Diagnosis not present

## 2020-05-23 DIAGNOSIS — O98513 Other viral diseases complicating pregnancy, third trimester: Secondary | ICD-10-CM | POA: Diagnosis not present

## 2020-05-23 DIAGNOSIS — R319 Hematuria, unspecified: Secondary | ICD-10-CM | POA: Diagnosis not present

## 2020-05-23 DIAGNOSIS — Z3A31 31 weeks gestation of pregnancy: Secondary | ICD-10-CM | POA: Diagnosis not present

## 2020-05-23 DIAGNOSIS — U071 COVID-19: Secondary | ICD-10-CM | POA: Diagnosis not present

## 2020-05-23 DIAGNOSIS — O368130 Decreased fetal movements, third trimester, not applicable or unspecified: Secondary | ICD-10-CM

## 2020-05-23 DIAGNOSIS — O26893 Other specified pregnancy related conditions, third trimester: Secondary | ICD-10-CM | POA: Diagnosis not present

## 2020-05-23 LAB — URINALYSIS, ROUTINE W REFLEX MICROSCOPIC
Bilirubin Urine: NEGATIVE
Glucose, UA: NEGATIVE mg/dL
Hgb urine dipstick: NEGATIVE
Ketones, ur: NEGATIVE mg/dL
Nitrite: NEGATIVE
Protein, ur: NEGATIVE mg/dL
Specific Gravity, Urine: 1.018 (ref 1.005–1.030)
pH: 7 (ref 5.0–8.0)

## 2020-05-23 LAB — BASIC METABOLIC PANEL
Anion gap: 10 (ref 5–15)
BUN: <5 mg/dL — ABNORMAL LOW (ref 6–20)
CO2: 21 mmol/L — ABNORMAL LOW (ref 22–32)
Calcium: 9 mg/dL (ref 8.9–10.3)
Chloride: 104 mmol/L (ref 98–111)
Creatinine, Ser: 0.46 mg/dL (ref 0.44–1.00)
GFR, Estimated: >60 mL/min (ref >60)
Glucose, Bld: 78 mg/dL (ref 70–99)
Potassium: 4.3 mmol/L (ref 3.5–5.1)
Sodium: 135 mmol/L (ref 135–145)

## 2020-05-23 MED ORDER — CEFADROXIL 500 MG PO CAPS: 500.0000 mg | ORAL_CAPSULE | Freq: Two times a day (BID) | ORAL | 0 refills | Status: DC

## 2020-05-23 NOTE — MAU Note (Signed)
Pt reports she had a small blood clot come out when she urinated. Denies any burning or urinary frequency or cramping. Does report decreased fetal movement.Marland Kitchen

## 2020-05-23 NOTE — Discharge Instructions (Signed)
Pregnancy and Urinary Tract Infection  A urinary tract infection (UTI) is an infection of any part of the urinary tract. This includes the kidneys, the tubes that connect your kidneys to your bladder (ureters), the bladder, and the tube that carries urine out of your body (urethra). These organs make, store, and get rid of urine in the body. Your health care provider may use other names to describe the infection. An upper UTI affects the ureters and kidneys (pyelonephritis). A lower UTI affects the bladder (cystitis) and urethra (urethritis). Most urinary tract infections are caused by bacteria in your genital area, around the entrance to your urinary tract (urethra). These bacteria grow and cause irritation and inflammation of your urinary tract. You are more likely to develop a UTI during pregnancy because the physical and hormonal changes your body goes through can make it easier for bacteria to get into your urinary tract. Your growing baby also puts pressure on your bladder and can affect urine flow. It is important to recognize and treat UTIs in pregnancy because of the risk of serious complications for both you and your baby. How does this affect me? Symptoms of a UTI include:  Needing to urinate right away (urgently).  Frequent urination or passing small amounts of urine frequently.  Pain or burning with urination.  Blood in the urine.  Urine that smells bad or unusual.  Trouble urinating.  Cloudy urine.  Pain in the abdomen or lower back.  Vaginal discharge. You may also have:  Vomiting or a decreased appetite.  Confusion.  Irritability or tiredness.  A fever.  Diarrhea. How does this affect my baby? An untreated UTI during pregnancy could lead to a kidney infection or a systemic infection, which can cause health problems that could affect your baby. Possible complications of an untreated UTI include:  Giving birth to your baby before 37 weeks of pregnancy  (premature).  Having a baby with a low birth weight.  Developing high blood pressure during pregnancy (preeclampsia).  Having a low hemoglobin level (anemia). What can I do to lower my risk? To prevent a UTI:  Go to the bathroom as soon as you feel the need. Do not hold urine for long periods of time.  Always wipe from front to back, especially after a bowel movement. Use each tissue one time when you wipe.  Empty your bladder after sex.  Keep your genital area dry.  Drink 6-10 glasses of water each day.  Do not douche or use deodorant sprays. How is this treated? Treatment for this condition may include:  Antibiotic medicines that are safe to take during pregnancy.  Other medicines to treat less common causes of UTI. Follow these instructions at home:  If you were prescribed an antibiotic medicine, take it as told by your health care provider. Do not stop using the antibiotic even if you start to feel better.  Keep all follow-up visits as told by your health care provider. This is important. Contact a health care provider if:  Your symptoms do not improve or they get worse.  You have abnormal vaginal discharge. Get help right away if you:  Have a fever.  Have nausea and vomiting.  Have back or side pain.  Feel contractions in your uterus.  Have lower belly pain.  Have a gush of fluid from your vagina.  Have blood in your urine. Summary  A urinary tract infection (UTI) is an infection of any part of the urinary tract, which includes the   kidneys, ureters, bladder, and urethra.  Most urinary tract infections are caused by bacteria in your genital area, around the entrance to your urinary tract (urethra).  You are more likely to develop a UTI during pregnancy.  If you were prescribed an antibiotic medicine, take it as told by your health care provider. Do not stop using the antibiotic even if you start to feel better. This information is not intended to  replace advice given to you by your health care provider. Make sure you discuss any questions you have with your health care provider. Document Revised: 08/15/2018 Document Reviewed: 03/27/2018 Elsevier Patient Education  2021 Elsevier Inc.  

## 2020-05-23 NOTE — MAU Provider Note (Signed)
History     CSN: [id number]  Arrival date and time: 05/23/20 1348   Event Date/Time   First Provider Initiated Contact with Patient 05/23/20 1500       Chief Complaint  Patient presents with  . Hematuria   HPI This is a 35 yo G2P1001 at [redacted]w[redacted]d presents with decreased fetal movement at home and an episode of hematuria earlier today. The hematuria is described as a small blood clot. Denies dysuria, flank pain, frequency, fevers, chills. She was diagnosed with COVID about 5 days ago.  Since being here, has felt the baby move. No contractions, leaking fluid, vaginal discharge.  OB History    Gravida  2   Para  1   Term  1   Preterm      AB      Living  1     SAB      IAB      Ectopic      Multiple  0   Live Births  1           Past Medical History:  Diagnosis Date  . Anemia   . CIN I (cervical intraepithelial neoplasia I) 10/25/10  . GERD (gastroesophageal reflux disease)   . H/O bladder infections   . H/O varicella   . Headache   . Hx: UTI (urinary tract infection) 10/25/10  . Vaginal Pap smear, abnormal   . Vulvitis 02/16/2008  . Weight loss 09/2010    Past Surgical History:  Procedure Laterality Date  . CESAREAN SECTION N/A 09/25/2017   Procedure: CESAREAN SECTION;  Surgeon: Vickie Graff, MD;  Location: [facility];  Service: Obstetrics;  Laterality: N/A;  . WISDOM TOOTH EXTRACTION      Family History  Problem Relation Age of Onset  . Thyroid disease Mother   . Hypertension Mother     Social History   Tobacco Use  . Smoking status: Never Smoker  . Smokeless tobacco: Never Used  Substance Use Topics  . Alcohol use: No    Comment: occasional social drinker  . Drug use: No    Allergies: No Known Allergies  Medications Prior to Admission  Medication Sig Dispense Refill Last Dose  . Prenatal Vit-Fe Fumarate-FA (MULTIVITAMIN-PRENATAL) 27-0.8 MG TABS tablet Take 1 tablet by mouth daily at 12 noon.   05/23/2020 at Unknown time  .  ibuprofen (ADVIL,MOTRIN) 600 MG tablet Take 1 tablet (600 mg total) by mouth every 6 (six) hours as needed. 30 tablet 1     Review of Systems Physical Exam   Blood pressure 109/65, pulse 97, temperature 98.5 F (36.9 C), resp. rate 18, height 5\' 3"  (1.6 m), weight 86.2 kg, unknown if currently breastfeeding.  Physical Exam Vitals and nursing note reviewed.  Cardiovascular:     Pulses: Normal pulses.  Pulmonary:     Effort: Pulmonary effort is normal.  Abdominal:     General: Abdomen is flat. There is no distension.     Palpations: Abdomen is soft. There is no mass.     Tenderness: There is no abdominal tenderness. There is no guarding or rebound.     Hernia: No hernia is present.  Skin:    General: Skin is warm and dry.     Capillary Refill: Capillary refill takes less than 2 seconds.  Neurological:     General: No focal deficit present.  Psychiatric:        Mood and Affect: Mood normal.        Behavior: Behavior  normal.        Thought Content: Thought content normal.        Judgment: Judgment normal.    Results for orders placed or performed during the hospital encounter of 05/23/20 (from the past 24 hour(s))  Urinalysis, Routine w reflex microscopic Urine, Clean Catch     Status: Abnormal   Collection Time: 05/23/20  2:58 PM  Result Value Ref Range   Color, Urine YELLOW YELLOW   APPearance CLOUDY (A) CLEAR   Specific Gravity, Urine 1.018 1.005 - 1.030   pH 7.0 5.0 - 8.0   Glucose, UA NEGATIVE NEGATIVE mg/dL   Hgb urine dipstick NEGATIVE NEGATIVE   Bilirubin Urine NEGATIVE NEGATIVE   Ketones, ur NEGATIVE NEGATIVE mg/dL   Protein, ur NEGATIVE NEGATIVE mg/dL   Nitrite NEGATIVE NEGATIVE   Leukocytes,Ua LARGE (A) NEGATIVE   RBC / HPF 0-5 0 - 5 RBC/hpf   WBC, UA 0-5 0 - 5 WBC/hpf   Bacteria, UA RARE (A) NONE SEEN   Squamous Epithelial / LPF 6-10 0 - 5   Mucus PRESENT   Basic metabolic panel     Status: Abnormal   Collection Time: 05/23/20  3:07 PM  Result Value Ref  Range   Sodium 135 135 - 145 mmol/L   Potassium 4.3 3.5 - 5.1 mmol/L   Chloride 104 98 - 111 mmol/L   CO2 21 (L) 22 - 32 mmol/L   Glucose, Bld 78 70 - 99 mg/dL   BUN <5 (L) 6 - 20 mg/dL   Creatinine, Ser 0.76 0.44 - 1.00 mg/dL   Calcium 9.0 8.9 - 22.6 mg/dL   GFR, Estimated >33 >35 mL/min   Anion gap 10 5 - 15     MAU Course  Procedures NST 120s, moderate variability, mulitple accelerations, no decelerations.  MDM COVID 19 can cause AKI, often presenting with hematuria. No evidence of AKI.  Large leukocytes in urine - will empirically treat for UTI with duricef.  Assessment and Plan  1. [redacted] weeks gestation of pregnancy  2. Hematuria, unspecified type  3. Decreased fetal movements in third trimester, single or unspecified fetus  4. COVID-19 affecting pregnancy in third trimester  NST reactive Treat with duricef. Return precautions given. Discharge to home.   Vickie Norris 05/23/2020, 3:00 PM

## 2020-05-24 LAB — URINE CULTURE: Culture: <10000 — AB

## 2020-06-13 ENCOUNTER — Other Ambulatory Visit: Payer: Self-pay | Admitting: Obstetrics and Gynecology

## 2020-07-12 ENCOUNTER — Telehealth (HOSPITAL_COMMUNITY): Payer: Self-pay | Admitting: *Deleted

## 2020-07-12 NOTE — Patient Instructions (Addendum)
Shadell Ruedas  07/12/2020   Your procedure is scheduled on:  07/19/2020  Arrive at 0730 at [employer] [facility] on [address] at [facility]  and [employer]. You are invited to use the FREE valet parking or use the Visitor's parking deck.  Pick up the phone at the desk and dial [phone].  Call this number if you have problems the morning of surgery: [phone]  Remember:   Do not eat food:(After Midnight) Desps de medianoche.  Do not drink clear liquids: (After Midnight) Desps de medianoche.  Take these medicines the morning of surgery with A SIP OF WATER:  Take macrobid and macrodantin as prescribed   Do not wear jewelry, make-up or nail polish.  Do not wear lotions, powders, or perfumes. Do not wear deodorant.  Do not shave 48 hours prior to surgery.  Do not bring valuables to the hospital.  [facility] is not   responsible for any belongings or valuables brought to the hospital.  Contacts, dentures or bridgework may not be worn into surgery.  Leave suitcase in the car. After surgery it may be brought to your room.  For patients admitted to the hospital, checkout time is 11:00 AM the day of              discharge.      Please read over the following fact sheets that you were given:     Preparing for Surgery

## 2020-07-12 NOTE — Telephone Encounter (Signed)
Preadmission screen  

## 2020-07-13 ENCOUNTER — Encounter (HOSPITAL_COMMUNITY): Payer: Self-pay

## 2020-07-18 ENCOUNTER — Encounter (HOSPITAL_COMMUNITY)
Admission: RE | Admit: 2020-07-18 | Discharge: 2020-07-18 | Disposition: A | Discharge status: Home / Self Care | Payer: BC Managed Care – PPO | Source: Ambulatory Visit | Attending: Obstetrics and Gynecology | Admitting: Obstetrics and Gynecology

## 2020-07-18 ENCOUNTER — Other Ambulatory Visit: Payer: Self-pay

## 2020-07-18 DIAGNOSIS — Z01812 Encounter for preprocedural laboratory examination: Secondary | ICD-10-CM | POA: Insufficient documentation

## 2020-07-18 LAB — CBC
HCT: 34.7 % — ABNORMAL LOW (ref 36.0–46.0)
Hemoglobin: 11.4 g/dL — ABNORMAL LOW (ref 12.0–15.0)
MCH: 30.1 pg (ref 26.0–34.0)
MCHC: 32.9 g/dL (ref 30.0–36.0)
MCV: 91.6 fL (ref 80.0–100.0)
Platelets: 253 10*3/uL (ref 150–400)
RBC: 3.79 MIL/uL — ABNORMAL LOW (ref 3.87–5.11)
RDW: 14.2 % (ref 11.5–15.5)
WBC: 8.8 10*3/uL (ref 4.0–10.5)
nRBC: 0 % (ref 0.0–0.2)

## 2020-07-18 LAB — TYPE AND SCREEN
ABO/RH(D): O POS
Antibody Screen: NEGATIVE

## 2020-07-18 LAB — BASIC METABOLIC PANEL
Anion gap: 9 (ref 5–15)
BUN: 7 mg/dL (ref 6–20)
CO2: 21 mmol/L — ABNORMAL LOW (ref 22–32)
Calcium: 9.1 mg/dL (ref 8.9–10.3)
Chloride: 105 mmol/L (ref 98–111)
Creatinine, Ser: 0.54 mg/dL (ref 0.44–1.00)
GFR, Estimated: >60 mL/min (ref >60)
Glucose, Bld: 84 mg/dL (ref 70–99)
Potassium: 4.1 mmol/L (ref 3.5–5.1)
Sodium: 135 mmol/L (ref 135–145)

## 2020-07-18 LAB — RPR: RPR Ser Ql: NONREACTIVE

## 2020-07-19 ENCOUNTER — Inpatient Hospital Stay (HOSPITAL_COMMUNITY): Payer: BC Managed Care – PPO

## 2020-07-19 ENCOUNTER — Inpatient Hospital Stay (HOSPITAL_COMMUNITY)
Admission: RE | Admit: 2020-07-19 | Discharge: 2020-07-21 | DRG: 788 | Disposition: A | Discharge status: Home / Self Care | Payer: BC Managed Care – PPO | Attending: Obstetrics and Gynecology | Admitting: Obstetrics and Gynecology

## 2020-07-19 ENCOUNTER — Other Ambulatory Visit: Payer: Self-pay

## 2020-07-19 ENCOUNTER — Encounter (HOSPITAL_COMMUNITY): Payer: Self-pay | Admitting: Obstetrics and Gynecology

## 2020-07-19 ENCOUNTER — Encounter (HOSPITAL_COMMUNITY)
Admission: RE | Disposition: A | Discharge status: Home / Self Care | Payer: Self-pay | Source: Home / Self Care | Attending: Obstetrics and Gynecology

## 2020-07-19 DIAGNOSIS — E669 Obesity, unspecified: Secondary | ICD-10-CM | POA: Diagnosis present

## 2020-07-19 DIAGNOSIS — Z3A39 39 weeks gestation of pregnancy: Secondary | ICD-10-CM

## 2020-07-19 DIAGNOSIS — O34211 Maternal care for low transverse scar from previous cesarean delivery: Secondary | ICD-10-CM | POA: Diagnosis present

## 2020-07-19 DIAGNOSIS — O9902 Anemia complicating childbirth: Secondary | ICD-10-CM | POA: Diagnosis present

## 2020-07-19 DIAGNOSIS — O99214 Obesity complicating childbirth: Secondary | ICD-10-CM | POA: Diagnosis present

## 2020-07-19 DIAGNOSIS — D649 Anemia, unspecified: Secondary | ICD-10-CM | POA: Diagnosis present

## 2020-07-19 DIAGNOSIS — Z98891 History of uterine scar from previous surgery: Secondary | ICD-10-CM

## 2020-07-19 SURGERY — Surgical Case
Anesthesia: Spinal | Wound class: Clean Contaminated

## 2020-07-19 MED ORDER — NALBUPHINE HCL 10 MG/ML IJ SOLN: 5.0000 mg | INTRAMUSCULAR | Status: DC | PRN

## 2020-07-19 MED ORDER — OXYTOCIN-SODIUM CHLORIDE 30-0.9 UT/500ML-% IV SOLN: 2.5000 [IU]/h | INTRAVENOUS | Status: AC

## 2020-07-19 MED ORDER — SENNOSIDES-DOCUSATE SODIUM 8.6-50 MG PO TABS
2.0000 | ORAL_TABLET | ORAL | Status: DC
  Administered 2020-07-20: 2 via ORAL
  Filled 2020-07-19: qty 2

## 2020-07-19 MED ORDER — ZOLPIDEM TARTRATE 5 MG PO TABS: 5.0000 mg | ORAL_TABLET | Freq: Every evening | ORAL | Status: DC | PRN

## 2020-07-19 MED ORDER — COCONUT OIL OIL: 1.0000 "application " | TOPICAL_OIL | Status: DC | PRN

## 2020-07-19 MED ORDER — KETOROLAC TROMETHAMINE 30 MG/ML IJ SOLN: 30.0000 mg | Freq: Four times a day (QID) | INTRAMUSCULAR | Status: AC | PRN

## 2020-07-19 MED ORDER — PHENYLEPHRINE HCL-NACL 20-0.9 MG/250ML-% IV SOLN
INTRAVENOUS | Status: DC | PRN
  Administered 2020-07-19: 60 ug/min via INTRAVENOUS

## 2020-07-19 MED ORDER — KETOROLAC TROMETHAMINE 30 MG/ML IJ SOLN
INTRAMUSCULAR | Status: AC
  Filled 2020-07-19: qty 1

## 2020-07-19 MED ORDER — SIMETHICONE 80 MG PO CHEW
80.0000 mg | CHEWABLE_TABLET | Freq: Three times a day (TID) | ORAL | Status: DC
  Administered 2020-07-19 – 2020-07-21 (×6): 80 mg via ORAL
  Filled 2020-07-19 (×6): qty 1

## 2020-07-19 MED ORDER — SODIUM CHLORIDE 0.9 % IR SOLN
Status: DC | PRN
  Administered 2020-07-19: 1000 mL

## 2020-07-19 MED ORDER — NALBUPHINE HCL 10 MG/ML IJ SOLN: 5.0000 mg | Freq: Once | INTRAMUSCULAR | Status: DC | PRN

## 2020-07-19 MED ORDER — ONDANSETRON HCL 4 MG/2ML IJ SOLN
4.0000 mg | Freq: Three times a day (TID) | INTRAMUSCULAR | Status: DC | PRN
Start: 2020-07-19 — End: 2020-07-21

## 2020-07-19 MED ORDER — MENTHOL 3 MG MT LOZG: 1.0000 | LOZENGE | OROMUCOSAL | Status: DC | PRN

## 2020-07-19 MED ORDER — BUPIVACAINE IN DEXTROSE 0.75-8.25 % IT SOLN
INTRATHECAL | Status: DC | PRN
  Administered 2020-07-19: 1.55 mL via INTRATHECAL

## 2020-07-19 MED ORDER — SIMETHICONE 80 MG PO CHEW
80.0000 mg | CHEWABLE_TABLET | ORAL | Status: DC | PRN
Start: 2020-07-19 — End: 2020-07-21

## 2020-07-19 MED ORDER — STERILE WATER FOR IRRIGATION IR SOLN
Status: DC | PRN
  Administered 2020-07-19: 1000 mL

## 2020-07-19 MED ORDER — FENTANYL CITRATE (PF) 100 MCG/2ML IJ SOLN
50.0000 ug | Freq: Once | INTRAMUSCULAR | Status: AC
  Administered 2020-07-19: 50 ug via INTRAVENOUS

## 2020-07-19 MED ORDER — SCOPOLAMINE 1 MG/3DAYS TD PT72
MEDICATED_PATCH | TRANSDERMAL | Status: AC
  Filled 2020-07-19: qty 1

## 2020-07-19 MED ORDER — DEXMEDETOMIDINE HCL 200 MCG/2ML IV SOLN
INTRAVENOUS | Status: DC | PRN
  Administered 2020-07-19: 8 ug via INTRAVENOUS

## 2020-07-19 MED ORDER — SODIUM CHLORIDE 0.9% FLUSH: 3.0000 mL | INTRAVENOUS | Status: DC | PRN

## 2020-07-19 MED ORDER — OXYTOCIN-SODIUM CHLORIDE 30-0.9 UT/500ML-% IV SOLN
INTRAVENOUS | Status: DC | PRN
  Administered 2020-07-19: 30 [IU] via INTRAVENOUS

## 2020-07-19 MED ORDER — SCOPOLAMINE 1 MG/3DAYS TD PT72: 1.0000 | MEDICATED_PATCH | Freq: Once | TRANSDERMAL | Status: DC

## 2020-07-19 MED ORDER — KETOROLAC TROMETHAMINE 30 MG/ML IJ SOLN
30.0000 mg | Freq: Four times a day (QID) | INTRAMUSCULAR | Status: AC | PRN
  Administered 2020-07-19 – 2020-07-20 (×4): 30 mg via INTRAVENOUS
  Filled 2020-07-19 (×3): qty 1

## 2020-07-19 MED ORDER — FENTANYL CITRATE (PF) 100 MCG/2ML IJ SOLN
INTRAMUSCULAR | Status: AC
  Filled 2020-07-19: qty 2

## 2020-07-19 MED ORDER — DEXMEDETOMIDINE (PRECEDEX) IN NS 20 MCG/5ML (4 MCG/ML) IV SYRINGE
PREFILLED_SYRINGE | INTRAVENOUS | Status: AC
  Filled 2020-07-19: qty 5

## 2020-07-19 MED ORDER — WITCH HAZEL-GLYCERIN EX PADS
1.0000 | MEDICATED_PAD | CUTANEOUS | Status: DC | PRN
Start: 2020-07-19 — End: 2020-07-21

## 2020-07-19 MED ORDER — DIPHENHYDRAMINE HCL 50 MG/ML IJ SOLN: 12.5000 mg | INTRAMUSCULAR | Status: DC | PRN

## 2020-07-19 MED ORDER — LACTATED RINGERS IV SOLN: INTRAVENOUS | Status: DC

## 2020-07-19 MED ORDER — PHENYLEPHRINE HCL (PRESSORS) 10 MG/ML IV SOLN: INTRAVENOUS | Status: DC | PRN

## 2020-07-19 MED ORDER — DEXAMETHASONE SODIUM PHOSPHATE 10 MG/ML IJ SOLN
INTRAMUSCULAR | Status: AC
  Filled 2020-07-19: qty 1

## 2020-07-19 MED ORDER — MORPHINE SULFATE (PF) 0.5 MG/ML IJ SOLN
INTRAMUSCULAR | Status: DC | PRN
  Administered 2020-07-19: 150 ug via INTRATHECAL

## 2020-07-19 MED ORDER — MORPHINE SULFATE (PF) 0.5 MG/ML IJ SOLN
INTRAMUSCULAR | Status: AC
  Filled 2020-07-19: qty 10

## 2020-07-19 MED ORDER — FENTANYL CITRATE (PF) 100 MCG/2ML IJ SOLN
INTRAMUSCULAR | Status: DC | PRN
  Administered 2020-07-19: 15 ug via INTRATHECAL

## 2020-07-19 MED ORDER — FENTANYL CITRATE (PF) 100 MCG/2ML IJ SOLN
INTRAMUSCULAR | Status: DC | PRN
  Administered 2020-07-19: 85 ug via INTRAVENOUS

## 2020-07-19 MED ORDER — NALOXONE HCL 4 MG/10ML IJ SOLN
1.0000 ug/kg/h | INTRAVENOUS | Status: DC | PRN
  Filled 2020-07-19: qty 5

## 2020-07-19 MED ORDER — DEXAMETHASONE SODIUM PHOSPHATE 4 MG/ML IJ SOLN
INTRAMUSCULAR | Status: DC | PRN
  Administered 2020-07-19: 10 mg via INTRAVENOUS

## 2020-07-19 MED ORDER — DIPHENHYDRAMINE HCL 25 MG PO CAPS
25.0000 mg | ORAL_CAPSULE | Freq: Four times a day (QID) | ORAL | Status: DC | PRN
Start: 2020-07-19 — End: 2020-07-21

## 2020-07-19 MED ORDER — ONDANSETRON HCL 4 MG/2ML IJ SOLN
INTRAMUSCULAR | Status: AC
  Filled 2020-07-19: qty 2

## 2020-07-19 MED ORDER — MEPERIDINE HCL 25 MG/ML IJ SOLN: 6.2500 mg | INTRAMUSCULAR | Status: DC | PRN

## 2020-07-19 MED ORDER — DIPHENHYDRAMINE HCL 25 MG PO CAPS: 25.0000 mg | ORAL_CAPSULE | ORAL | Status: DC | PRN

## 2020-07-19 MED ORDER — ONDANSETRON HCL 4 MG/2ML IJ SOLN
INTRAMUSCULAR | Status: DC | PRN
  Administered 2020-07-19: 4 mg via INTRAVENOUS

## 2020-07-19 MED ORDER — TETANUS-DIPHTH-ACELL PERTUSSIS 5-2.5-18.5 LF-MCG/0.5 IM SUSY: 0.5000 mL | PREFILLED_SYRINGE | Freq: Once | INTRAMUSCULAR | Status: DC

## 2020-07-19 MED ORDER — NALBUPHINE HCL 10 MG/ML IJ SOLN
5.0000 mg | Freq: Once | INTRAMUSCULAR | Status: DC | PRN
Start: 2020-07-19 — End: 2020-07-21

## 2020-07-19 MED ORDER — PHENYLEPHRINE HCL-NACL 20-0.9 MG/250ML-% IV SOLN
INTRAVENOUS | Status: AC
  Filled 2020-07-19: qty 250

## 2020-07-19 MED ORDER — OXYTOCIN-SODIUM CHLORIDE 30-0.9 UT/500ML-% IV SOLN
INTRAVENOUS | Status: AC
  Filled 2020-07-19: qty 500

## 2020-07-19 MED ORDER — DIBUCAINE (PERIANAL) 1 % EX OINT
1.0000 | TOPICAL_OINTMENT | CUTANEOUS | Status: DC | PRN
Start: 2020-07-19 — End: 2020-07-21

## 2020-07-19 MED ORDER — DEXTROSE 5 % IV SOLN
3.0000 g | INTRAVENOUS | Status: AC
  Administered 2020-07-19: 2 g via INTRAVENOUS

## 2020-07-19 MED ORDER — PRENATAL MULTIVITAMIN CH
1.0000 | ORAL_TABLET | Freq: Every day | ORAL | Status: DC
  Administered 2020-07-20 – 2020-07-21 (×2): 1 via ORAL
  Filled 2020-07-19 (×2): qty 1

## 2020-07-19 MED ORDER — NALOXONE HCL 0.4 MG/ML IJ SOLN: 0.4000 mg | INTRAMUSCULAR | Status: DC | PRN

## 2020-07-19 MED ORDER — CEFAZOLIN SODIUM-DEXTROSE 2-4 GM/100ML-% IV SOLN
INTRAVENOUS | Status: AC
  Filled 2020-07-19: qty 100

## 2020-07-19 MED ORDER — SCOPOLAMINE 1 MG/3DAYS TD PT72
1.0000 | MEDICATED_PATCH | TRANSDERMAL | Status: DC
  Administered 2020-07-19: 1.5 mg via TRANSDERMAL

## 2020-07-19 MED ORDER — POVIDONE-IODINE 10 % EX SWAB
2.0000 "application " | Freq: Once | CUTANEOUS | Status: AC
  Administered 2020-07-19: 2 via TOPICAL

## 2020-07-19 SURGICAL SUPPLY — 40 items
APL SKNCLS STERI-STRIP NONHPOA (GAUZE/BANDAGES/DRESSINGS) ×1
BENZOIN TINCTURE PRP APPL 2/3 (GAUZE/BANDAGES/DRESSINGS) ×2 IMPLANT
CHLORAPREP W/TINT 26ML (MISCELLANEOUS) ×2 IMPLANT
CLAMP CORD UMBIL (MISCELLANEOUS) IMPLANT
CLOSURE STERI STRIP 1/2 X4 (GAUZE/BANDAGES/DRESSINGS) ×1 IMPLANT
CLOTH BEACON ORANGE TIMEOUT ST (SAFETY) ×2 IMPLANT
DRAIN JACKSON PRT FLT 10 (DRAIN) IMPLANT
DRSG OPSITE POSTOP 4X10 (GAUZE/BANDAGES/DRESSINGS) ×2 IMPLANT
ELECT REM PT RETURN 9FT ADLT (ELECTROSURGICAL) ×2
ELECTRODE REM PT RTRN 9FT ADLT (ELECTROSURGICAL) ×1 IMPLANT
EVACUATOR SILICONE 100CC (DRAIN) IMPLANT
EXTRACTOR VACUUM M CUP 4 TUBE (SUCTIONS) IMPLANT
GLOVE BIO SURGEON STRL SZ 6.5 (GLOVE) ×2 IMPLANT
GLOVE BIOGEL PI IND STRL 7.0 (GLOVE) ×2 IMPLANT
GLOVE BIOGEL PI INDICATOR 7.0 (GLOVE) ×2
GOWN STRL REUS W/TWL LRG LVL3 (GOWN DISPOSABLE) ×4 IMPLANT
HEMOSTAT SURGICEL 2X14 (HEMOSTASIS) ×1 IMPLANT
KIT ABG SYR 3ML LUER SLIP (SYRINGE) IMPLANT
NDL HYPO 25X5/8 SAFETYGLIDE (NEEDLE) IMPLANT
NEEDLE HYPO 25X5/8 SAFETYGLIDE (NEEDLE) IMPLANT
NS IRRIG 1000ML POUR BTL (IV SOLUTION) ×2 IMPLANT
PACK C SECTION WH (CUSTOM PROCEDURE TRAY) ×2 IMPLANT
PAD OB MATERNITY 4.3X12.25 (PERSONAL CARE ITEMS) ×2 IMPLANT
PENCIL SMOKE EVAC W/HOLSTER (ELECTROSURGICAL) ×2 IMPLANT
RTRCTR C-SECT PINK 25CM LRG (MISCELLANEOUS) IMPLANT
SPONGE LAP 18X18 RF (DISPOSABLE) ×1 IMPLANT
STRIP CLOSURE SKIN 1/2X4 (GAUZE/BANDAGES/DRESSINGS) ×2 IMPLANT
SUT CHROMIC 0 CT 1 (SUTURE) ×2 IMPLANT
SUT MNCRL AB 3-0 PS2 27 (SUTURE) ×2 IMPLANT
SUT PLAIN 2 0 (SUTURE) ×4
SUT PLAIN 2 0 XLH (SUTURE) ×2 IMPLANT
SUT PLAIN ABS 2-0 CT1 27XMFL (SUTURE) ×2 IMPLANT
SUT SILK 2 0 SH (SUTURE) IMPLANT
SUT VIC AB 0 CTX 36 (SUTURE) ×12
SUT VIC AB 0 CTX36XBRD ANBCTRL (SUTURE) ×4 IMPLANT
SUT VIC AB 2-0 SH 27 (SUTURE)
SUT VIC AB 2-0 SH 27XBRD (SUTURE) IMPLANT
TOWEL OR 17X24 6PK STRL BLUE (TOWEL DISPOSABLE) ×2 IMPLANT
TRAY FOLEY W/BAG SLVR 14FR LF (SET/KITS/TRAYS/PACK) ×2 IMPLANT
WATER STERILE IRR 1000ML POUR (IV SOLUTION) ×2 IMPLANT

## 2020-07-19 NOTE — H&P (Signed)
Vickie Norris is a 35 y.o. female presenting for repeat CS at 2 1/7 weeks she is without complaints. OB History    Gravida  2   Para  1   Term  1   Preterm      AB      Living  1     SAB      IAB      Ectopic      Multiple  0   Live Births  1          Past Medical History:  Diagnosis Date  . Anemia   . CIN I (cervical intraepithelial neoplasia I) 10/25/10  . GERD (gastroesophageal reflux disease)   . H/O bladder infections   . H/O varicella   . Headache   . Hx: UTI (urinary tract infection) 10/25/10  . Vaginal Pap smear, abnormal   . Vulvitis 02/16/2008  . Weight loss 09/2010   Past Surgical History:  Procedure Laterality Date  . CESAREAN SECTION N/A 09/25/2017   Procedure: CESAREAN SECTION;  Surgeon: Jaymes Graff, MD;  Location: [facility];  Service: Obstetrics;  Laterality: N/A;  . WISDOM TOOTH EXTRACTION     Family History: family history includes Autoimmune disease in her mother; Hypertension in her mother; Thyroid disease in her mother. Social History:  reports that she has never smoked. She has never used smokeless tobacco. She reports that she does not drink alcohol and does not use drugs.     Maternal Diabetes: No Genetic Screening: Normal Maternal Ultrasounds/Referrals: Normal Fetal Ultrasounds or other Referrals:  None Maternal Substance Abuse:  No Significant Maternal Medications:  None Significant Maternal Lab Results:  Group B Strep negative Other Comments:  None  Review of Systems History   Blood pressure 118/79, pulse 90, temperature 98 F (36.7 C), temperature source Oral, height 5\' 3"  (1.6 m), weight 93 kg, SpO2 98 %, unknown if currently breastfeeding. Exam Physical Exam  Physical Examination: General appearance - alert, well appearing, and in no distress Chest - clear to auscultation, no wheezes, rales or rhonchi, symmetric air entry Heart - normal rate and regular rhythm Abdomen - soft, nontender, nondistended, no  masses or organomegaly gravid Extremities - Homan's sign negative bilaterally Prenatal labs: ABO, Rh: --/--/O POS (03/14 1003) Antibody: NEG (03/14 1003) Rubella:   RPR: NON REACTIVE (03/14 1030)  HBsAg:    HIV:    GBS:     Assessment/Plan: Term for repeat CS VBAc vs CS reviewed in detail with her and her partner She decided for CS.  She understands the risks are but not limited to bleeding, infection , damage to bowel, bladder and internal organs Date of Initial H&P: today  History reviewed, patient examined, no change in status, stable for surgery.   Mozel Burdett A Pisarski 07/19/2020, 9:41 AM

## 2020-07-19 NOTE — Op Note (Signed)
Cesarean Section Procedure Note   Vickie Norris  07/19/2020  Indications: Scheduled Proceedure/Maternal Request   Pre-operative Diagnosis: repeat cesarean section .   Post-operative Diagnosis: Same   Surgeon: Surgeon(s) and Role:    * Jaymes Graff, MD - Primary   Assistants: Dr Su Hilt MD   Anesthesia: spinal   Procedure Details:  The patient was seen in the Holding Room. The risks, benefits, complications, treatment options, and expected outcomes were discussed with the patient. The patient concurred with the proposed plan, giving informed consent. identified as Nurse, adult and the procedure verified as C-Section Delivery. A Time Out was held and the above information confirmed.  After induction of anesthesia, the patient was draped and prepped in the usual sterile manner. A transverse incision was made and carried down through the subcutaneous tissue to the fascia. Fascial incision was made in the midline and extended transversely. The fascia was separated from the underlying rectus muscle superiorly and inferiorly. The peritoneum was identified and entered. Peritoneal incision was extended longitudinally with good visualization of bowel and bladder. The utero-vesical peritoneal reflection was incised transversely and the bladder flap was bluntly freed from the lower uterine segment.  An alexsis retractor was placed in the abdomen.   A low transverse uterine incision was made. Delivered from cephalic presentation was a  infant, with Apgar scores of 8 at one minute and 9 at five minutes. Cord ph was not sent the umbilical cord was clamped and cut cord blood was obtained for evaluation. The placenta was removed Intact and appeared normal. The uterine outline, tubes and ovaries appeared normal}. The uterine incision was closed with running locked sutures of 0Vicryl. A second layer 0 vicrlyl was used to imbricate the uterine incision    Hemostasis was observed. Lavage was carried out  until clear. The alexsis was removed.  The peritoneum was closed with 0 chromic.  The muscles were examined and any bleeders were made hemostatic using bovie cautery device.   The fascia was then reapproximated with running sutures of 0 vicryl.  The subcutaneous tissue was reapproximated  With interrupted stitches using 2-0 plain gut. The subcuticular closure was performed using 3-28monocryl     Instrument, sponge, and needle counts were correct prior the abdominal closure and were correct at the conclusion of the case.    Findings: infant was delivered from vtx presentation. The fluid was clear.  The uterus tubes and ovaries appeared normal.     Estimated Blood Loss:   Total IV Fluids:   Urine Output: 100CC OF clear urine  Specimens: none  Complications: no complications  Disposition: PACU - hemodynamically stable.   Maternal Condition: stable   Baby condition / location:  Couplet care / Skin to Skin  Attending Attestation: I performed the procedure.   Signed: Surgeon(s): Jaymes Graff, MD

## 2020-07-19 NOTE — Anesthesia Preprocedure Evaluation (Signed)
Anesthesia Evaluation  Patient identified by MRN, date of birth, ID band Patient awake    Reviewed: Allergy & Precautions, NPO status , Patient's Chart, lab work & pertinent test results  Airway Mallampati: II  TM Distance: >3 FB Neck ROM: Full    Dental  (+) Teeth Intact, Dental Advisory Given   Pulmonary neg pulmonary ROS,    Pulmonary exam normal breath sounds clear to auscultation       Cardiovascular negative cardio ROS Normal cardiovascular exam Rhythm:Regular Rate:Normal     Neuro/Psych negative neurological ROS     GI/Hepatic Neg liver ROS, GERD  ,  Endo/Other  negative endocrine ROSObesity   Renal/GU negative Renal ROS     Musculoskeletal negative musculoskeletal ROS (+)   Abdominal   Peds  Hematology  (+) Blood dyscrasia, anemia , Plt 243k   Anesthesia Other Findings Day of surgery medications reviewed with the patient.  Reproductive/Obstetrics (+) Pregnancy                             Anesthesia Physical  Anesthesia Plan  ASA: II  Anesthesia Plan: Spinal   Post-op Pain Management:    Induction:   PONV Risk Score and Plan: 2 and Dexamethasone, Ondansetron, Scopolamine patch - Pre-op and Treatment may vary due to age or medical condition  Airway Management Planned: Natural Airway  Additional Equipment:   Intra-op Plan:   Post-operative Plan:   Informed Consent: I have reviewed the patients History and Physical, chart, labs and discussed the procedure including the risks, benefits and alternatives for the proposed anesthesia with the patient or authorized representative who has indicated his/her understanding and acceptance.     Dental advisory given  Plan Discussed with: CRNA, Anesthesiologist and Surgeon  Anesthesia Plan Comments:         Anesthesia Quick Evaluation

## 2020-07-19 NOTE — Transfer of Care (Signed)
Immediate Anesthesia Transfer of Care Note  Patient: Vickie Norris  Procedure(s) Performed: REPEAT CESAREAN SECTION (N/A )  Patient Location: PACU  Anesthesia Type:Spinal  Level of Consciousness: awake, alert  and oriented  Airway & Oxygen Therapy: Patient Spontanous Breathing  Post-op Assessment: Report given to RN and Post -op Vital signs reviewed and stable  Post vital signs: Reviewed and stable  Last Vitals:  Vitals Value Taken Time  BP 118/64 07/19/20 1116  Temp 37.3 C 07/19/20 1115  Pulse 83 07/19/20 1122  Resp 13 07/19/20 1122  SpO2 98 % 07/19/20 1122  Vitals shown include unvalidated device data.  Last Pain:  Vitals:   07/19/20 1115  TempSrc:   PainSc: 5          Complications: No complications documented.

## 2020-07-19 NOTE — Anesthesia Postprocedure Evaluation (Signed)
Anesthesia Post Note  Patient: Vickie Norris  Procedure(s) Performed: REPEAT CESAREAN SECTION (N/A )     Patient location during evaluation: PACU Anesthesia Type: Spinal Level of consciousness: oriented and awake and alert Pain management: pain level controlled Vital Signs Assessment: post-procedure vital signs reviewed and stable Respiratory status: spontaneous breathing, respiratory function stable and patient connected to nasal cannula oxygen Cardiovascular status: blood pressure returned to baseline and stable Postop Assessment: no headache, no backache and no apparent nausea or vomiting Anesthetic complications: no   No complications documented.  Last Vitals:  Vitals:   07/19/20 1217 07/19/20 1320  BP: (!) 122/56 (!) 104/51  Pulse: 84 71  Resp: 18 18  Temp: 37.7 C 36.9 C  SpO2: 95% 97%    Last Pain:  Vitals:   07/19/20 1320  TempSrc: Oral  PainSc:                  Rheba Diamond

## 2020-07-19 NOTE — Anesthesia Procedure Notes (Addendum)
Spinal  Patient location during procedure: OR Start time: 07/19/2020 9:45 AM End time: 07/19/2020 9:50 AM Staffing Anesthesiologist: Bethena Midget, MD Preanesthetic Checklist Completed: patient identified, IV checked, site marked, risks and benefits discussed, surgical consent, monitors and equipment checked, pre-op evaluation and timeout performed Spinal Block Patient position: sitting Prep: DuraPrep Patient monitoring: heart rate, cardiac monitor, continuous pulse ox and blood pressure Approach: midline Location: L3-4 Injection technique: single-shot Needle Needle type: Sprotte  Needle gauge: 24 G Needle length: 9 cm Assessment Sensory level: T4

## 2020-07-20 ENCOUNTER — Encounter (HOSPITAL_COMMUNITY): Payer: Self-pay | Admitting: Obstetrics and Gynecology

## 2020-07-20 LAB — CBC
HCT: 28.8 % — ABNORMAL LOW (ref 36.0–46.0)
Hemoglobin: 9.5 g/dL — ABNORMAL LOW (ref 12.0–15.0)
MCH: 30.8 pg (ref 26.0–34.0)
MCHC: 33 g/dL (ref 30.0–36.0)
MCV: 93.5 fL (ref 80.0–100.0)
Platelets: 231 10*3/uL (ref 150–400)
RBC: 3.08 MIL/uL — ABNORMAL LOW (ref 3.87–5.11)
RDW: 14.4 % (ref 11.5–15.5)
WBC: 16.2 10*3/uL — ABNORMAL HIGH (ref 4.0–10.5)
nRBC: 0 % (ref 0.0–0.2)

## 2020-07-20 LAB — BIRTH TISSUE RECOVERY COLLECTION (PLACENTA DONATION)

## 2020-07-20 MED ORDER — IBUPROFEN 600 MG PO TABS
600.0000 mg | ORAL_TABLET | Freq: Four times a day (QID) | ORAL | Status: DC
  Administered 2020-07-20 – 2020-07-21 (×5): 600 mg via ORAL
  Filled 2020-07-20 (×5): qty 1

## 2020-07-20 MED ORDER — SALINE SPRAY 0.65 % NA SOLN
1.0000 | NASAL | Status: DC | PRN
  Administered 2020-07-20: 1 via NASAL
  Filled 2020-07-20: qty 44

## 2020-07-20 MED ORDER — OXYCODONE HCL 5 MG PO TABS
5.0000 mg | ORAL_TABLET | ORAL | Status: DC | PRN
  Administered 2020-07-20: 5 mg via ORAL
  Administered 2020-07-20 – 2020-07-21 (×5): 10 mg via ORAL
  Filled 2020-07-20: qty 1
  Filled 2020-07-20 (×5): qty 2

## 2020-07-20 MED ORDER — POLYSACCHARIDE IRON COMPLEX 150 MG PO CAPS
150.0000 mg | ORAL_CAPSULE | Freq: Every day | ORAL | Status: DC
  Administered 2020-07-20 – 2020-07-21 (×2): 150 mg via ORAL
  Filled 2020-07-20 (×2): qty 1

## 2020-07-20 MED ORDER — MAGNESIUM OXIDE 400 (241.3 MG) MG PO TABS
400.0000 mg | ORAL_TABLET | Freq: Every day | ORAL | Status: DC
  Administered 2020-07-20 – 2020-07-21 (×2): 400 mg via ORAL
  Filled 2020-07-20 (×2): qty 1

## 2020-07-20 NOTE — Progress Notes (Signed)
Subjective: POD# 1 Information for the patient's newborn:  Vickie Norris, Vickie Norris [[id number]]  female   Circumcision - requesting in pt circ.  Reports feeling "good" Feeding: breast Reports tolerating PO and denies N/V, foley removed, ambulating and urinating w/o difficulty  Pain controlled with acetaminophen, ibuprofen (OTC) and narcotic analgesics including oxycodone Denies HA/SOB/dizziness  Flatus present Vaginal bleeding is normal, denies clots     Objective:  VS:  Vitals:   07/19/20 1530 07/19/20 1830 07/19/20 2125 07/20/20 0205  BP: 110/60 102/61  (!) 108/51  Pulse: 85 72  (!) 56  Resp: 16 20 18 18   Temp: 98.4 F (36.9 C) 99.4 F (37.4 C) 97.9 F (36.6 C) 98.7 F (37.1 C)  TempSrc: Oral Oral Oral Oral  SpO2: 97% 97% 99% 99%  Weight:      Height:        Intake/Output Summary (Last 24 hours) at 07/20/2020 1425 Last data filed at 07/20/2020 0945 Gross per 24 hour  Intake 1350 ml  Output 1225 ml  Net 125 ml     Recent Labs    07/18/20 1030 07/20/20 0445  WBC 8.8 16.2*  HGB 11.4* 9.5*  HCT 34.7* 28.8*  PLT 253 231    Blood type: --/--/O POS (03/14 1003) Rubella:      Physical Exam:  General: alert, cooperative and no distress CV: Regular rate and rhythm Resp: clear Abdomen: soft, nontender, normal bowel sounds Incision: dry and intact, old drainage noted to dsg Perineum: intact Uterine Fundus: firm, below umbilicus, nontender Lochia: minimal Ext: extremities normal, atraumatic, no cyanosis or edema   Assessment/Plan: 35 y.o.   POD# 1. 20. Had nosebleed today. Encouraged saline nasal spray. Pt reports she thinks it has helped. Old blood noted to dsg. Will change dsg.                 Active Problems:   Encounter for maternal care for low transverse scar from repeat cesarean delivery   S/P repeat low transverse C-section   Routine post-op PP care          Advance diet as tolerated Advised warm fluids and ambulation to improve GI  motility Encourage rest when baby rests May shower Breastfeeding support Anticipate D/C 07/22/20  Pt status and POC reviewed with Dr 07/24/20  Mora Appl, MSN, CNM 07/20/2020, 2:25 PM

## 2020-07-20 NOTE — Lactation Note (Signed)
This note was copied from a baby's chart. Lactation Consultation Note  Patient Name: Vickie Norris [id number] Date: 07/20/2020 Reason for consult: Initial assessment Age:34 hours   P2, Mother breastfed her first child for 2 yrs. Mother didn't initially request to see lactation. Today mother request to see LC.  Mother reports that she is unsure if infant is getting anything from her breast . She reports that she feels that infant is using her as a pacifier.  LC ask mother if she would like assistance with hand expression. Mother reluctantly said okay. LC taught hand expression  Mother was able to get a few drops of colostrum out.   Infant very sleepy. Mother in a laid back position with infant fully dressed. Mother reports that she wanted to use football hold. Infant placed on the left breast and unzipped his sleeper. Infant was difficult to wake or rouse without doing STS. Several attempts were made to wake infant . Infant showed no feeding cues.  Suggested that mother page for [facility] when infant is awake and ready for a feeding.  Reviewed cue base feeding and cluster feeding. Mother reports that she will page for staff nurse or [facility] for assistance.  All mothers questions were answered and she was satisfied.   Maternal Data Has patient been taught Hand Expression?: Yes Does the patient have breastfeeding experience prior to this delivery?: Yes How long did the patient breastfeed?: 2 yrs with a 34 yr old  Feeding Mother's Current Feeding Choice: Breast Milk  LATCH Score                    Lactation Tools Discussed/Used    Interventions    Discharge    Consult Status Consult Status: Follow-up Date: 07/20/20 Follow-up type: In-patient    Stevan Born [facility] 07/20/2020, 9:51 AM

## 2020-07-20 NOTE — Lactation Note (Signed)
This note was copied from a baby's chart. Lactation Consultation Note  Patient Name: Vickie Norris HYIFO'Y Date: 07/20/2020   Age:34 hours  LC had clerk call into Mom's room to see if she wanted a visit from Lactation. Mom stated breastfeeding going well and she declined a visit at this time.   Maternal Data    Feeding    LATCH Score                    Lactation Tools Discussed/Used    Interventions    Discharge    Consult Status      [name]  [name] 07/20/2020, 8:27 PM

## 2020-07-21 MED ORDER — IBUPROFEN 600 MG PO TABS: 600.0000 mg | ORAL_TABLET | Freq: Four times a day (QID) | ORAL | 0 refills | Status: DC

## 2020-07-21 MED ORDER — MAGNESIUM OXIDE 400 (241.3 MG) MG PO TABS: 400.0000 mg | ORAL_TABLET | Freq: Every day | ORAL | Status: DC

## 2020-07-21 MED ORDER — OXYCODONE HCL 5 MG PO TABS: 5.0000 mg | ORAL_TABLET | ORAL | 0 refills | Status: DC | PRN

## 2020-07-21 MED ORDER — POLYSACCHARIDE IRON COMPLEX 150 MG PO CAPS: 150.0000 mg | ORAL_CAPSULE | Freq: Every day | ORAL | 0 refills | Status: DC

## 2020-07-21 MED ORDER — ACETAMINOPHEN 325 MG PO TABS: 650.0000 mg | ORAL_TABLET | ORAL | 2 refills | Status: DC | PRN

## 2020-07-21 NOTE — Lactation Note (Signed)
This note was copied from a baby's chart. Lactation Consultation Note  Patient Name: Boy Leone Mobley OMVEH'M Date: 07/21/2020   Age:34 hours   LC ask staff nurse if she thought mother would like a follow up visit.  Tylene Fantasia RN reports that mother is doing well and plans to go home. She reports that she will go over engorgement and Mastitis with her when she does her discharge.   Maternal Data    Feeding    LATCH Score Latch: Grasps breast easily, tongue down, lips flanged, rhythmical sucking.  Audible Swallowing: Spontaneous and intermittent  Type of Nipple: Everted at rest and after stimulation  Comfort (Breast/Nipple): Soft / non-tender  Hold (Positioning): Assistance needed to correctly position infant at breast and maintain latch.  LATCH Score: 9   Lactation Tools Discussed/Used    Interventions    Discharge    Consult Status      Stevan Born [facility] 07/21/2020, 12:25 PM

## 2020-07-21 NOTE — Discharge Summary (Signed)
Repeat Cesarean Section OB Discharge Summary  Patient Name: Vickie Norris DOB: 1985-11-24 MRN: [id number]  Date of admission: 07/19/2020 Intrauterine pregnancy: [redacted]w[redacted]d   Admitting diagnosis: Encounter for maternal care for low transverse scar from repeat cesarean delivery [O34.211] S/P repeat low transverse C-section [Z98.891] Secondary diagnosis: None  Date of discharge: 07/21/2020    Discharge diagnosis: Term Pregnancy Delivered     Prenatal history: G2P2002   EDC : 07/25/2020, by Other Basis  Prenatal care at [facility]  Primary provider : [facility] Prenatal course complicated by Anemia of pregnancy  Prenatal Labs: ABO, Rh: --/--/O POS (03/14 1003)  Antibody: NEG (03/14 1003) Rubella:   Immune  RPR: NON REACTIVE (03/14 1030)  HBsAg:   Negative HIV:   Negative                                   Hospital course:  Sceduled C/S   35 y.o. yo G2P2002 at [redacted]w[redacted]d was admitted to the hospital 07/19/2020 for scheduled cesarean section with the following indication:Elective Repeat.Delivery details are as follows:  Membrane Rupture Time/Date: 10:23 AM ,07/19/2020   Delivery Method:C-Section, Low Transverse  Details of operation can be found in separate operative note.  Patient had an uncomplicated postpartum course.  She is ambulating, tolerating a regular diet, passing flatus, and urinating well. Patient is discharged home in stable condition on  07/21/20        Newborn Data: Birth date:07/19/2020  Birth time:10:23 AM  Gender:Female  Living status:Living  Apgars:8 ,9  Weight:3780 g    Delivering PROVIDER: Jaymes Graff                                                            Complications: None  Newborn Data: Live born female  Birth Weight: 8 lb 5.3 oz (3780 g) APGAR: 8, 9  Newborn Delivery   Birth date/time: 07/19/2020 10:23:00 Delivery type: C-Section, Low Transverse Trial of labor: No C-section categorization: Repeat      Baby Feeding: Breast Disposition:home with  mother  Post partum procedures: PO Iron  Labs: Lab Results  Component Value Date   WBC 16.2 (H) 07/20/2020   HGB 9.5 (L) 07/20/2020   HCT 28.8 (L) 07/20/2020   MCV 93.5 07/20/2020   PLT 231 07/20/2020   CMP Latest Ref Rng & Units 07/18/2020  Glucose 70 - 99 mg/dL 84  BUN 6 - 20 mg/dL 7  Creatinine 3.57 - 0.17 mg/dL 7.93  Sodium 903 - 009 mmol/L 135  Potassium 3.5 - 5.1 mmol/L 4.1  Chloride 98 - 111 mmol/L 105  CO2 22 - 32 mmol/L 21(L)  Calcium 8.9 - 10.3 mg/dL 9.1    Physical Exam @ time of discharge:  Vitals:   07/20/20 0205 07/20/20 1452 07/20/20 2035 07/21/20 0508  BP: (!) 108/51 (!) 104/57 (!) 100/51 94/60  Pulse: (!) 56 79 79 88  Resp: 18 16 16 19   Temp: 98.7 F (37.1 C) 98.4 F (36.9 C) 99.3 F (37.4 C) 98.2 F (36.8 C)  TempSrc: Oral Oral Oral Oral  SpO2: 99% 99% 98% 97%  Weight:      Height:       general: alert, cooperative and no distress lochia: appropriate uterine  fundus: firm incision: Healing well with no significant drainage, clean dsg to be applied before discharge extremities: DVT Evaluation: No evidence of DVT seen on physical exam. Negative Homan's sign. No cords or calf tenderness. No significant calf/ankle edema.  Discharge instructions:  "Baby and Me Booklet"  Discharge Medications:  Tylenol Ibuprofen Oxycodone (20 tabs) Niferex PNV  Diet: routine diet Activity: Advance as tolerated. Pelvic rest x 6 weeks.  Follow up:1 week for incision check 4 weeks for pp visit  Dr Sallye Ober aware of pt status and POC.  Signed: Carollee Leitz MSN, CNM 07/21/2020, 11:55 AM

## 2020-07-21 NOTE — Procedures (Deleted)
  The note originally documented on this encounter has been moved the the encounter in which it belongs.  

## 2021-07-20 ENCOUNTER — Encounter: Payer: Self-pay | Admitting: Podiatry

## 2021-07-20 ENCOUNTER — Other Ambulatory Visit: Payer: Self-pay | Admitting: Podiatry

## 2021-07-20 ENCOUNTER — Ambulatory Visit (INDEPENDENT_AMBULATORY_CARE_PROVIDER_SITE_OTHER): Payer: BC Managed Care – PPO

## 2021-07-20 ENCOUNTER — Other Ambulatory Visit: Payer: Self-pay

## 2021-07-20 ENCOUNTER — Ambulatory Visit (INDEPENDENT_AMBULATORY_CARE_PROVIDER_SITE_OTHER): Payer: BC Managed Care – PPO | Admitting: Podiatry

## 2021-07-20 DIAGNOSIS — M2011 Hallux valgus (acquired), right foot: Secondary | ICD-10-CM

## 2021-07-20 DIAGNOSIS — M778 Other enthesopathies, not elsewhere classified: Secondary | ICD-10-CM

## 2021-07-20 MED ORDER — DEXAMETHASONE SODIUM PHOSPHATE 120 MG/30ML IJ SOLN
2.0000 mg | Freq: Once | INTRAMUSCULAR | Status: AC
  Administered 2021-07-20: 2 mg via INTRA_ARTICULAR

## 2021-07-20 NOTE — Progress Notes (Signed)
?  Subjective:  ?Patient ID: Vickie Norris, female    DOB: Jan 28, 1986,  MRN: [id number] ?HPI ?Chief Complaint  ?Patient presents with  ? Foot Pain  ?  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 New Patient (Initial Visit)  ? ? ?36 y.o. female presents with the above complaint.  ? ?ROS: Denies fever chills nausea vomiting muscle aches pains calf pain back pain chest pain shortness of breath. ? ?Past Medical History:  ?Diagnosis Date  ? Anemia   ? CIN I (cervical intraepithelial neoplasia I) 10/25/10  ? GERD (gastroesophageal reflux disease)   ? H/O bladder infections   ? H/O varicella   ? Headache   ? Hx: UTI (urinary tract infection) 10/25/10  ? Vaginal Pap smear, abnormal   ? Vulvitis 02/16/2008  ? Weight loss 09/2010  ? ?Past Surgical History:  ?Procedure Laterality Date  ? CESAREAN SECTION N/A 09/25/2017  ? Procedure: CESAREAN SECTION;  Surgeon: Jaymes Graff, MD;  Location: [facility]IR[facility]ING SU[facility];  Service: Obstetrics;  Laterality: N/A;  ? CESAREAN SECTION N/A 07/19/2020  ? Procedure: REPEAT CESAREAN SECTION;  Surgeon: Jaymes Graff, MD;  Location: [facility] LD ORS;  Service: Obstetrics;  Laterality: N/A;  ? WISDOM TOOTH EXTRACTION    ? ? ?Current Outpatient Medications:  ?  cetirizine (ZYRTEC) 10 MG tablet, Take 10 mg by mouth daily., Disp: , Rfl:  ?  norethindrone (MICRONOR) 0.35 MG tablet, Take 1 tablet by mouth daily., Disp: , Rfl:  ? ?No Known Allergies ?Review of Systems ?Objective:  ?There were no vitals filed for this visit. ? ?General: Well developed, nourished, in no acute distress, alert and oriented x3  ? ?Dermatological: Skin is warm, dry and supple bilateral. Nails x 10 are well maintained; remaining integument appears unremarkable at this time. There are no open sores, no preulcerative lesions, no rash or signs of infection present. ? ?Vascular: Dorsalis Pedis artery and Posterior Tibial artery pedal pulses are 2/4 bilateral with immedate capillary fill time. Pedal hair growth present. No  varicosities and no lower extremity edema present bilateral.  ? ?Neruologic: Grossly intact via light touch bilateral. Vibratory intact via tuning fork bilateral. Protective threshold with Semmes Wienstein monofilament intact to all pedal sites bilateral. Patellar and Achilles deep tendon reflexes 2+ bilateral. No Babinski or clonus noted bilateral.  ? ?Musculoskeletal: No gross boney pedal deformities bilateral. No pain, crepitus, or limitation noted with foot and ankle range of motion bilateral. Muscular strength 5/5 in all groups tested bilateral. ? ?Gait: Unassisted, Nonantalgic.  ? ? ?Radiographs: ? 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  Abductovalgus deformity with mild tenderness on palpation of the proximal lateral base of the proximal phalanx at the metatarsophalangeal joint.  Mild tenderness on sharp dorsiflexion. ? ?Assessment & Plan:  ? ?Assessment: Capsulitis hallux valgus first metatarsophalangeal joint right foot ? ?Plan: Discussed etiology pathology conservative surgical therapies at this point I injected the first metatarsophalangeal joint 2 mg dexamethasone local anesthetic.  She tolerated procedure well without complications.  Discussed topicals to use when she quits breast-feeding. ? ? ? ? ?Vickie Norris T. Norwood, DPM ?

## 2021-08-01 ENCOUNTER — Encounter (HOSPITAL_BASED_OUTPATIENT_CLINIC_OR_DEPARTMENT_OTHER): Payer: Self-pay | Admitting: Emergency Medicine

## 2021-08-01 ENCOUNTER — Other Ambulatory Visit: Payer: Self-pay

## 2021-08-01 ENCOUNTER — Emergency Department (HOSPITAL_BASED_OUTPATIENT_CLINIC_OR_DEPARTMENT_OTHER)
Admission: EM | Admit: 2021-08-01 | Discharge: 2021-08-01 | Disposition: A | Discharge status: Home / Self Care | Payer: BC Managed Care – PPO | Attending: Emergency Medicine | Admitting: Emergency Medicine

## 2021-08-01 ENCOUNTER — Emergency Department (HOSPITAL_BASED_OUTPATIENT_CLINIC_OR_DEPARTMENT_OTHER): Payer: BC Managed Care – PPO

## 2021-08-01 ENCOUNTER — Other Ambulatory Visit (HOSPITAL_BASED_OUTPATIENT_CLINIC_OR_DEPARTMENT_OTHER): Payer: Self-pay

## 2021-08-01 DIAGNOSIS — R1032 Left lower quadrant pain: Secondary | ICD-10-CM | POA: Diagnosis present

## 2021-08-01 DIAGNOSIS — R109 Unspecified abdominal pain: Secondary | ICD-10-CM

## 2021-08-01 DIAGNOSIS — R3911 Hesitancy of micturition: Secondary | ICD-10-CM | POA: Insufficient documentation

## 2021-08-01 DIAGNOSIS — N390 Urinary tract infection, site not specified: Secondary | ICD-10-CM

## 2021-08-01 LAB — URINALYSIS, MICROSCOPIC (REFLEX)
RBC / HPF: >50 RBC/hpf (ref 0–5)
WBC, UA: >50 WBC/hpf (ref 0–5)

## 2021-08-01 LAB — URINALYSIS, ROUTINE W REFLEX MICROSCOPIC
Bilirubin Urine: NEGATIVE
Glucose, UA: NEGATIVE mg/dL
Ketones, ur: 40 mg/dL — AB
Nitrite: NEGATIVE
Protein, ur: 30 mg/dL — AB
Specific Gravity, Urine: 1.015 (ref 1.005–1.030)
pH: 6 (ref 5.0–8.0)

## 2021-08-01 LAB — PREGNANCY, URINE: Preg Test, Ur: NEGATIVE

## 2021-08-01 MED ORDER — CEPHALEXIN 500 MG PO CAPS
500.0000 mg | ORAL_CAPSULE | Freq: Two times a day (BID) | ORAL | 0 refills | Status: DC
  Filled 2021-08-01: qty 14, 7d supply, fill #0

## 2021-08-01 MED ORDER — KETOROLAC TROMETHAMINE 30 MG/ML IJ SOLN
30.0000 mg | Freq: Once | INTRAMUSCULAR | Status: AC
  Administered 2021-08-01: 30 mg via INTRAVENOUS
  Filled 2021-08-01: qty 1

## 2021-08-01 NOTE — ED Provider Notes (Signed)
Patient care was taken over from Dr. Preston Fleeting.  Patient and presented with some lower back pain radiating to her abdomen associated with some urinary symptoms.  She is not febrile.  She has had no vomiting.  She has had some associated nausea.  Her CT scan shows no ureteral stones.  No hydronephrosis.  There is a questionable stone in the right kidney.  Her urine does appear to be consistent with infection.  Will start on Keflex.  She is otherwise well-appearing.  She was discharged home in good condition.  Return precautions were given.  She was encouraged to follow-up with her PCP for recheck. ?  ?Rolan Bucco, MD ?08/01/21 0800 ? ?

## 2021-08-01 NOTE — ED Notes (Signed)
Lab notified to add urine culture 

## 2021-08-01 NOTE — ED Notes (Signed)
Patient transported to CT 

## 2021-08-01 NOTE — ED Provider Notes (Signed)
?[facility] EMERGENCY DEPARTMENT ?Provider Note ? ? ?CSN: [id number] ?Arrival date & time: 08/01/21  3300 ? ?  ? ?History ? ?Chief Complaint  ?Patient presents with  ? Back Pain  ? ? ?Vickie Norris is a 36 y.o. female. ? ?The history is provided by the patient.  ?Back Pain ?He has history of GERD and states that she was awakened at about 1:30 AM by pain across her lower back with some radiation into the lower abdomen.  Pain was bilateral, but she noted that when her husband massaged her left side, that seemed to improve.  There is associated sense of having to urinate, but she is passing very little if any urine.  She denies nausea or vomiting.  Nothing makes pain better, nothing makes it worse.  She did take a dose of ibuprofen which gave slight, temporary relief.  She is taking oral contraceptives.  She denies history of prior pain in the past. ?  ?Home Medications ?Prior to Admission medications   ?Medication Sig Start Date End Date Taking? Authorizing Provider  ?cetirizine (ZYRTEC) 10 MG tablet Take 10 mg by mouth daily.    [provider]  ?norethindrone (MICRONOR) 0.35 MG tablet Take 1 tablet by mouth daily. 06/28/21   [provider]  ?etonogestrel-ethinyl estradiol (NUVARING) 0.12-0.015 MG/24HR vaginal ring Insert vaginally and leave in place for 3 consecutive weeks, then remove for 1 week. 12/03/11 09/24/17  Osborn Coho, MD  ?   ? ?Allergies    ?Patient has no known allergies.   ? ?Review of Systems   ?Review of Systems  ?Musculoskeletal:  Positive for back pain.  ?All other systems reviewed and are negative. ? ?Physical Exam ?Updated Vital Signs ?BP 110/73 (BP Location: Right Arm)   Pulse 99   Temp (!) 97.5 ?F (36.4 ?C) (Oral)   Resp 16   Ht 5\' 3"  (1.6 m)   LMP 07/30/2021 (Exact Date)   SpO2 95%   BMI 36.31 kg/m?  ?Physical Exam ?Vitals and nursing note reviewed.  ?36 year old female, appears restless, but is in no acute distress. Vital signs are normal. Oxygen  saturation is 95%, which is normal. ?Head is normocephalic and atraumatic. PERRLA, EOMI. Oropharynx is clear. ?Neck is nontender and supple without adenopathy or JVD. ?Back is nontender and there is no CVA tenderness. ?Lungs are clear without rales, wheezes, or rhonchi. ?Chest is nontender. ?Heart has regular rate and rhythm without murmur. ?Abdomen is soft, flat, with mild left lower quadrant tenderness.  There is no rebound or guarding.  There are no masses or hepatosplenomegaly and peristalsis is slightly hypoactive. ?Extremities have no cyanosis or edema, full range of motion is present. ?Skin is warm and dry without rash. ?Neurologic: Mental status is normal, cranial nerves are intact, moves all extremities equally. ? ?ED Results / Procedures / Treatments   ?Labs ?(all labs ordered are listed, but only abnormal results are displayed) ?Labs Reviewed  ?URINALYSIS, ROUTINE W REFLEX MICROSCOPIC - Abnormal; Notable for the following components:  ?    Result Value  ? APPearance TURBID (*)   ? Hgb urine dipstick LARGE (*)   ? Ketones, ur 40 (*)   ? Protein, ur 30 (*)   ? Leukocytes,Ua MODERATE (*)   ? All other components within normal limits  ?URINALYSIS, MICROSCOPIC (REFLEX) - Abnormal; Notable for the following components:  ? Bacteria, UA RARE (*)   ? All other components within normal limits  ?PREGNANCY, URINE  ? ?Radiology ?No results found. ? ?  Procedures ?Procedures  ? ? ?Medications Ordered in ED ?Medications  ?ketorolac (TORADOL) 30 MG/ML injection 30 mg (has no administration in time range)  ? ? ?ED Course/ Medical Decision Making/ A&P ?  ?                        ?Medical Decision Making ?Amount and/or Complexity of Data Reviewed ?Labs: ordered. ?Radiology: ordered. ? ?Risk ?Prescription drug management. ? ? ?Back pain with urinary hesitancy concerning for possible UTI, urolithiasis.  Pain could also be simply musculoskeletal pain.  Doubt abdominal aortic aneurysm, diverticulitis.  Presentation is somewhat  atypical, but consider ovarian cyst.  Old records are reviewed, and she had a CT scan of abdomen and pelvis in 2014 at which time there were no signs of kidney stones.  We will check screening labs, urinalysis and give a therapeutic trial of ketorolac. ? ?Pregnancy test is negative.  Urinalysis has greater than 50 RBCs and greater than 50 WBCs but negative nitrite.  This could be consistent with either urolithiasis or UTI.  CT scan is pending.  Case is signed out to Dr. Fredderick Phenix. ? ? ? ? ? ? ? ?Final Clinical Impression(s) / ED Diagnoses ?Final diagnoses:  ?Flank pain  ?Urinary hesitancy  ? ? ?Rx / DC Orders ?ED Discharge Orders   ? ? None  ? ?  ? ? ?  ?Dione Booze, MD ?08/01/21 [phone] ? ?

## 2021-08-01 NOTE — ED Triage Notes (Signed)
Pt is /co lower back pain that started this morning at 1 am  Pt states the pain radiates into her abdomen  Pt also states she has urgency to urinate   Pt adds the night before she had some nausea and diarrhea  ?

## 2021-08-02 LAB — URINE CULTURE: Culture: <10000 — AB

## 2021-08-22 ENCOUNTER — Ambulatory Visit: Payer: BC Managed Care – PPO | Admitting: Allergy

## 2021-08-23 NOTE — Progress Notes (Signed)
? ?New Patient Note ? ?RE: Vickie Norris MRN: [id number] DOB: [birth year] ?Date of Office Visit: 08/24/2021 ? ?Consult requested by: Irena Reichmann, DO ?Primary care provider: Irena Reichmann, DO ? ?Chief Complaint: Allergic Reaction (Constant reoccurrence of getting sick. Has been out of the house for a month and has been doing better. ), Allergy Testing (Would like to be tested for pineapple and shellfish ), and Other (Sore dry throat, coughing, watery eyes, headaches from being in the home with mold ) ? ?History of Present Illness: ?I had the pleasure of seeing Vickie Norris for initial evaluation at the [facility] on 08/26/2021. She is a 36 y.o. female, who is referred here by Irena Reichmann, DO for the evaluation of mold exposure. ? ?Patient has been living in an apartment for 2 years. She found some mold in the apartment including her clothing. She had air quality test done which was found to have high levels of mold.  ? ?She moved out of that apartment for about 1 month. The apartment is apparently fixing the mold issues. Her lease expires in August. ? ?Her mother apparently had some issues with mold and about 1 year later she passed away.  ? ?Patient feels better living out of the mold.  ? ?She reports symptoms of dry throat, sore throat, headaches, itchy throat, watery eyes, nasal congestion, rhinorrhea, sneezing. Symptoms have been going on for 6+ months. The symptoms are present all year around. Other triggers include exposure to mold. Anosmia: no. Headache: yes. She has used zyrtec with some improvement in symptoms. Sinus infections: no. Previous work up includes: no. ?Previous ENT evaluation: no. ?Previous sinus imaging: no. ?History of nasal polyps: no. ?Last eye exam: not recently. ?History of reflux: yes and does not take medications for this. ? ?Assessment and Plan: ?Vickie Norris is a 36 y.o. female with: ?Mold exposure ?Noticed increasing rhinitis, headache and URI like symptoms for  the past 6+ months. Recently found mold in apartment - which is being fixed. Patient concerned as mother had mold exposure and passed away 1 year later. Currently living in a friend's house x 1 month and noticed improvement in symptoms. Denies any other environmental allergy symptoms and no prior work up. ?Today's skin testing showed: Negative to indoor/outdoor allergens including mold.  ?Discussed with patient that I still recommend mold remediation of the home/apt. ?Start environmental control measures as below for mold.  ?Start Ryaltris (olopatadine + mometasone nasal spray combination) 1-2 sprays per nostril twice a day. Sample given. ?This will be mailed to you ?Nasal saline spray (i.e., Simply Saline) or nasal saline lavage (i.e., NeilMed) is recommended as needed and prior to medicated nasal sprays. ?Use olopatadine eye drops 0.7% once a day as needed for itchy/watery eyes. Samples given.  ?If no improvement recommend ENT evaluation next. ? ?Chronic rhinitis ?See assessment and plan as above. ? ?Gastroesophageal reflux disease ?See handout for lifestyle and dietary modifications.  ? ?Dietary counseling and surveillance ?Pineapple causes perioral discomfort. Spicy foods cause hiccups and once had GI issues with shellfish. ?Based on clinical history no indication for any skin testing today and patient agrees. ?Pineapple contains an enzyme called bromelain that can cause the oral irritation symptoms. ? ?Return if symptoms worsen or fail to improve. ? ?Meds ordered this encounter  ?Medications  ? Olopatadine-Mometasone (RYALTRIS) [id number] MCG/ACT SUSP  ?  Sig: Place 1-2 sprays into the nose in the morning and at bedtime.  ?  Dispense:  29 g  ?  Refill:  5  ?  [phone]  ? ?Lab Orders  ?No laboratory test(s) ordered today  ? ? ?Other allergy screening: ?Asthma: no ?Food allergy:  pineapple causes some perioral discomfort. ?Spicy foods cause some hiccups.  ? ?Dietary History: patient has been eating other foods  including milk, eggs, peanut, treenuts, sesame, limited shellfish, fish, soy, wheat, meats, fruits and vegetables. ? ?Medication allergy: no ?Hymenoptera allergy: no ?Large localized reaction. ? ?Urticaria: no ?Eczema:no ?History of recurrent infections suggestive of immunodeficency: no ? ?Diagnostics: ?Skin Testing: Environmental allergy panel. ?Negative to indoor/outdoor allergens including mold.  ?Results discussed with patient/family. ? Airborne Adult Perc - 08/24/21 1438   ? ? Time Antigen Placed 1438   ? Allergen Manufacturer Waynette ButteryGreer   ? Location Back   ? Number of Test 59   ? 1. Control-Buffer 50% Glycerol Negative   ? 2. Control-Histamine 1 mg/ml 3+   ? 3. Albumin saline Negative   ? 4. Bahia Negative   ? 5. [place] Negative   ? 6. Johnson Negative   ? 7. Kentucky Blue Negative   ? 8. Meadow Fescue Negative   ? 9. Perennial Rye Negative   ? 10. Sweet Vernal Negative   ? 11. Timothy Negative   ? 12. Cocklebur Negative   ? 13. Burweed Marshelder Negative   ? 14. Ragweed, short Negative   ? 15. Ragweed, Giant Negative   ? 16. Plantain,  English Negative   ? 17. Lamb's Quarters Negative   ? 18. Sheep Sorrell Negative   ? 19. Rough Pigweed Negative   ? 20. Marsh Elder, Rough Negative   ? 21. Mugwort, Common Negative   ? 22. Ash mix Negative   ? 23. Charletta CousinBirch mix Negative   ? 24. Beech American Negative   ? 25. Box, Elder Negative   ? 26. Cedar, red Negative   ? 27. Cottonwood, Guinea-Biss[place]astern Negative   ? 28. Elm mix Negative   ? 29. Hickory Negative   ? 30. Maple mix Negative   ? 31. Oak, Guinea-Biss[place]astern mix Negative   ? 32. Pecan Pollen Negative   ? 33. Pine mix Negative   ? 34. Sycamore Eastern Negative   ? 35. Walnut, Black Pollen Negative   ? 36. Alternaria alternata Negative   ? 37. Cladosporium Herbarum Negative   ? 38. Aspergillus mix Negative   ? 39. Penicillium mix Negative   ? 40. Bipolaris sorokiniana (Helminthosporium) Negative   ? 41. Drechslera spicifera (Curvularia) Negative   ? 42. Mucor plumbeus Negative   ? 43.  Fusarium moniliforme Negative   ? 44. Aureobasidium pullulans (pullulara) Negative   ? 45. Rhizopus oryzae Negative   ? 46. Botrytis cinera Negative   ? 47. Epicoccum nigrum Negative   ? 48. Phoma betae Negative   ? 49. Candida Albicans Negative   ? 50. Trichophyton mentagrophytes Negative   ? 51. Mite, D Farinae  5,000 AU/ml Negative   ? 52. Mite, D Pteronyssinus  5,000 AU/ml Negative   ? 53. Cat Hair 10,000 BAU/ml Negative   ? 54.  Dog Epithelia Negative   ? 55. Mixed Feathers Negative   ? 56. Horse Epithelia Negative   ? 57. Cockroach, Micrones[place] Negative   ? 58. Mouse Negative   ? 59. Tobacco Leaf Negative   ? ?  ?  ? ?  ? ? ?Past Medical History: ?Patient Active Problem List  ? Diagnosis Date Noted  ? Dietary counseling and surveillance 08/26/2021  ? 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  Chronic rhinitis 08/24/2021  ? Gastroesophageal reflux disease 08/24/2021  ? Anxiety 07/20/2021  ? Other allergy status, other than to drugs and biological substances 07/20/2021  ? Encounter for maternal care for low transverse scar from repeat cesarean delivery 07/19/2020  ? S/P repeat low transverse C-section 07/19/2020  ? Anemia 04/15/2019  ? Encounter for lactation counseling 01/06/2018  ? Lactation problem 01/06/2018  ? Premature rupture of membranes 09/24/2017  ? Abnormal cervical Papanicolaou smear 02/25/2017  ? Vitamin D deficiency 02/25/2017  ? Other chronic cystitis 02/22/2013  ? ?Past Medical History:  ?Diagnosis Date  ? Anemia   ? CIN I (cervical intraepithelial neoplasia I) 10/25/10  ? GERD (gastroesophageal reflux disease)   ? H/O bladder infections   ? H/O varicella   ? Headache   ? Hx: UTI (urinary tract infection) 10/25/10  ? Vaginal Pap smear, abnormal   ? Vulvitis 02/16/2008  ? Weight loss 09/2010  ? ?Past Surgical History: ?Past Surgical History:  ?Procedure Laterality Date  ? CESAREAN SECTION N/A 09/25/2017  ? Procedure: CESAREAN SECTION;  Surgeon: Jaymes Graff, MD;  Location: [facility];  Service: Obstetrics;   Laterality: N/A;  ? CESAREAN SECTION N/A 07/19/2020  ? Procedure: REPEAT CESAREAN SECTION;  Surgeon: Jaymes Graff, MD;  Location: [facility];  Service: Obstetrics;  Laterality: N/A;  ? WISDOM TOOTH EXTRACTION

## 2021-08-24 ENCOUNTER — Ambulatory Visit: Payer: BC Managed Care – PPO | Admitting: Allergy

## 2021-08-24 ENCOUNTER — Encounter: Payer: Self-pay | Admitting: Allergy

## 2021-08-24 VITALS — BP 120/86 | HR 80 | Temp 98.2°F | Resp 18 | Ht 65.75 in | Wt 167.1 lb

## 2021-08-24 DIAGNOSIS — Z77120 Contact with and (suspected) exposure to mold (toxic): Secondary | ICD-10-CM | POA: Diagnosis not present

## 2021-08-24 DIAGNOSIS — J310 Chronic rhinitis: Secondary | ICD-10-CM

## 2021-08-24 DIAGNOSIS — K219 Gastro-esophageal reflux disease without esophagitis: Secondary | ICD-10-CM

## 2021-08-24 DIAGNOSIS — Z713 Dietary counseling and surveillance: Secondary | ICD-10-CM

## 2021-08-24 MED ORDER — RYALTRIS 665-25 MCG/ACT NA SUSP: 1.0000 | Freq: Two times a day (BID) | NASAL | 5 refills | Status: AC

## 2021-08-24 NOTE — Patient Instructions (Addendum)
Today's skin testing showed: ?Negative to indoor/outdoor allergens including mold.  ? ?Results given. ? ?Still recommend to fix the mold issue in the home.  ?Start environmental control measures as below. ?Start Ryaltris (olopatadine + mometasone nasal spray combination) 1-2 sprays per nostril twice a day. Sample given. ?This will be mailed to you ?Nasal saline spray (i.e., Simply Saline) or nasal saline lavage (i.e., NeilMed) is recommended as needed and prior to medicated nasal sprays. ?Use olopatadine eye drops 0.7% once a day as needed for itchy/watery eyes. Samples given.  ?If no improvement recommend ENT evaluation next.  ? ?Heartburn: ?See handout for lifestyle and dietary modifications. ? ?Pineapple contains an enzyme called bromelain that can cause the oral irritation symptoms.  ? ?Follow up as needed.  ? ?Mold Control ?Mold and fungi can grow on a variety of surfaces provided certain temperature and moisture conditions exist.  ?Outdoor molds grow on plants, decaying vegetation and soil. The major outdoor mold, Alternaria and Cladosporium, are found in very high numbers during hot and dry conditions. Generally, a late summer - fall peak is seen for common outdoor fungal spores. Rain will temporarily lower outdoor mold spore count, but counts rise rapidly when the rainy period ends. ?The most important indoor molds are Aspergillus and Penicillium. Dark, humid and poorly ventilated basements are ideal sites for mold growth. The next most common sites of mold growth are the bathroom and the kitchen. ?Outdoor (Seasonal) Mold Control ?Use air conditioning and keep windows closed. ?Avoid exposure to decaying vegetation. ?Avoid leaf raking. ?Avoid grain handling. ?Consider wearing a face mask if working in moldy areas.  ?Indoor (Perennial) Mold Control  ?Maintain humidity below 50%. ?Get rid of mold growth on hard surfaces with water, detergent and, if necessary, 5% bleach (do not mix with other cleaners). Then  dry the area completely. If mold covers an area more than 10 square feet, consider hiring an indoor environmental professional. ?For clothing, washing with soap and water is best. If moldy items cannot be cleaned and dried, throw them away. ?Remove sources e.g. contaminated carpets. ?Repair and seal leaking roofs or pipes. Using dehumidifiers in damp basements may be helpful, but empty the water and clean units regularly to prevent mildew from forming. All rooms, especially basements, bathrooms and kitchens, require ventilation and cleaning to deter mold and mildew growth. Avoid carpeting on concrete or damp floors, and storing items in damp areas. ?

## 2021-08-26 DIAGNOSIS — Z713 Dietary counseling and surveillance: Secondary | ICD-10-CM | POA: Insufficient documentation

## 2021-08-26 NOTE — Assessment & Plan Note (Signed)
Noticed increasing rhinitis, headache and URI like symptoms for the past 6+ months. Recently found mold in apartment - which is being fixed. Patient concerned as mother had mold exposure and passed away 1 year later. Currently living in a friend's house x 1 month and noticed improvement in symptoms. Denies any other environmental allergy symptoms and no prior work up. ?? Today's skin testing showed: Negative to indoor/outdoor allergens including mold.  ?? Discussed with patient that I still recommend mold remediation of the home/apt. ?? Start environmental control measures as below for mold.  ?? Start Ryaltris (olopatadine + mometasone nasal spray combination) 1-2 sprays per nostril twice a day. Sample given. ?? This will be mailed to you ?? Nasal saline spray (i.e., Simply Saline) or nasal saline lavage (i.e., NeilMed) is recommended as needed and prior to medicated nasal sprays. ?? Use olopatadine eye drops 0.7% once a day as needed for itchy/watery eyes. Samples given.  ?? If no improvement recommend ENT evaluation next. ?

## 2021-08-26 NOTE — Assessment & Plan Note (Signed)
See handout for lifestyle and dietary modifications. 

## 2021-08-26 NOTE — Assessment & Plan Note (Signed)
.   See assessment and plan as above. 

## 2021-08-26 NOTE — Assessment & Plan Note (Signed)
Pineapple causes perioral discomfort. Spicy foods cause hiccups and once had GI issues with shellfish. ?? Based on clinical history no indication for any skin testing today and patient agrees. ?? Pineapple contains an enzyme called bromelain that can cause the oral irritation symptoms. ?

## 2022-04-26 ENCOUNTER — Other Ambulatory Visit: Payer: Self-pay

## 2022-04-26 ENCOUNTER — Emergency Department (HOSPITAL_BASED_OUTPATIENT_CLINIC_OR_DEPARTMENT_OTHER): Payer: BC Managed Care – PPO

## 2022-04-26 ENCOUNTER — Encounter (HOSPITAL_BASED_OUTPATIENT_CLINIC_OR_DEPARTMENT_OTHER): Payer: Self-pay | Admitting: Emergency Medicine

## 2022-04-26 DIAGNOSIS — R0789 Other chest pain: Secondary | ICD-10-CM | POA: Insufficient documentation

## 2022-04-26 DIAGNOSIS — R059 Cough, unspecified: Secondary | ICD-10-CM | POA: Insufficient documentation

## 2022-04-26 LAB — BASIC METABOLIC PANEL
Anion gap: 6 (ref 5–15)
BUN: 16 mg/dL (ref 6–20)
CO2: 25 mmol/L (ref 22–32)
Calcium: 9.1 mg/dL (ref 8.9–10.3)
Chloride: 103 mmol/L (ref 98–111)
Creatinine, Ser: 0.78 mg/dL (ref 0.44–1.00)
GFR, Estimated: >60 mL/min (ref >60)
Glucose, Bld: 149 mg/dL — ABNORMAL HIGH (ref 70–99)
Potassium: 3.3 mmol/L — ABNORMAL LOW (ref 3.5–5.1)
Sodium: 134 mmol/L — ABNORMAL LOW (ref 135–145)

## 2022-04-26 LAB — CBC
HCT: 37.6 % (ref 36.0–46.0)
Hemoglobin: 12.3 g/dL (ref 12.0–15.0)
MCH: 28.3 pg (ref 26.0–34.0)
MCHC: 32.7 g/dL (ref 30.0–36.0)
MCV: 86.4 fL (ref 80.0–100.0)
Platelets: 307 10*3/uL (ref 150–400)
RBC: 4.35 MIL/uL (ref 3.87–5.11)
RDW: 12.6 % (ref 11.5–15.5)
WBC: 10.3 10*3/uL (ref 4.0–10.5)
nRBC: 0 % (ref 0.0–0.2)

## 2022-04-26 LAB — TROPONIN I (HIGH SENSITIVITY): Troponin I (High Sensitivity): <2 ng/L (ref <18)

## 2022-04-26 NOTE — ED Triage Notes (Signed)
Pt reports chest "almost pressure but feels weird" starting "a couple of days ago."  She states she was feeling sick this weekend (tired, weakness, possible fever).  She also was told to come to the ED b/c she "could have a blood clot in my leg."  She reports pain that started in knee but now starts in the top of her hip and goes down her left leg.

## 2022-04-27 ENCOUNTER — Emergency Department (HOSPITAL_BASED_OUTPATIENT_CLINIC_OR_DEPARTMENT_OTHER)
Admission: EM | Admit: 2022-04-27 | Discharge: 2022-04-27 | Disposition: A | Discharge status: Home / Self Care | Payer: BC Managed Care – PPO | Attending: Emergency Medicine | Admitting: Emergency Medicine

## 2022-04-27 DIAGNOSIS — R0789 Other chest pain: Secondary | ICD-10-CM

## 2022-04-27 LAB — D-DIMER, QUANTITATIVE: D-Dimer, Quant: <0.27 ug/mL-FEU (ref 0.00–0.50)

## 2022-04-27 LAB — TROPONIN I (HIGH SENSITIVITY): Troponin I (High Sensitivity): <2 ng/L (ref <18)

## 2022-04-27 NOTE — ED Provider Notes (Signed)
[facility] EMERGENCY DEPARTMENT  Provider Note  CSN: [id number] Arrival date & time: 04/26/22 2138  History Chief Complaint  Patient presents with   Chest Pain    Artesha Wemhoff is a 36 y.o. female presents for evaluation of several days of intermittent 'weird feeling' in her left chest when she breaths. No particular provoking or relieving factors. No fever. Has had a cough. She also has had issues with her L leg/hip that are presumed to be sciatica. She called her PCP office earlier today and was advised to come to the ED to rule out PE. She has not had any leg swelling, calf pain/tenderness. No recent travel or surgeries.    Home Medications Prior to Admission medications   Medication Sig Start Date End Date Taking? Authorizing Provider  cetirizine (ZYRTEC) 10 MG tablet Take 10 mg by mouth daily.    [provider]  norethindrone (MICRONOR) 0.35 MG tablet Take 1 tablet by mouth daily. 06/28/21   [provider]  Olopatadine-Mometasone (Cristal Generous) [phone] MCG/ACT SUSP Place 1-2 sprays into the nose in the morning and at bedtime. 08/24/21   Ellamae Sia, DO  etonogestrel-ethinyl estradiol (NUVARING) 0.12-0.015 MG/24HR vaginal ring Insert vaginally and leave in place for 3 consecutive weeks, then remove for 1 week. 12/03/11 09/24/17  Osborn Coho, MD     Allergies    Patient has no known allergies.   Review of Systems   Review of Systems Please see HPI for pertinent positives and negatives  Physical Exam BP 120/82 (BP Location: Right Arm)   Pulse 80   Temp 98 F (36.7 C) (Oral)   Resp 18   Ht 5\' 4"  (1.626 m)   Wt 81.6 kg   SpO2 99%   BMI 30.90 kg/m   Physical Exam Vitals and nursing note reviewed.  Constitutional:      Appearance: Normal appearance.  HENT:     Head: Normocephalic and atraumatic.     Nose: Nose normal.     Mouth/Throat:     Mouth: Mucous membranes are moist.  Eyes:     Extraocular Movements: Extraocular movements  intact.     Conjunctiva/sclera: Conjunctivae normal.  Cardiovascular:     Rate and Rhythm: Normal rate.  Pulmonary:     Effort: Pulmonary effort is normal.     Breath sounds: Normal breath sounds.  Abdominal:     General: Abdomen is flat.     Palpations: Abdomen is soft.     Tenderness: There is no abdominal tenderness.  Musculoskeletal:        General: No swelling. Normal range of motion.     Cervical back: Neck supple.  Skin:    General: Skin is warm and dry.  Neurological:     General: No focal deficit present.     Mental Status: She is alert.  Psychiatric:        Mood and Affect: Mood normal.     ED Results / Procedures / Treatments   EKG EKG Interpretation  Date/Time:  Thursday April 26 2022 22:21:12 EST Ventricular Rate:  75 PR Interval:  140 QRS Duration: 90 QT Interval:  376 QTC Calculation: 419 R Axis:   72 Text Interpretation: Normal sinus rhythm Nonspecific T wave abnormality Abnormal ECG No previous ECGs available Confirmed by 04-15-1991 [phone]) on 04/27/2022 2:53:51 AM  Procedures Procedures  Medications Ordered in the ED Medications - No data to display  Initial Impression and Plan  Patient well appearing with atypical chest pain.  She is low risk for PE or CAD. Labs done in triage show normal CBC, BMP and initial Trop. Will check a dimer to further investigate symptoms.   ED Course   Clinical Course as of 04/27/22 0407  Fri Apr 27, 2022  0307 I personally viewed the images from radiology studies and agree with radiologist interpretation: CXR is clear.  [CS]  0404 Dimer and repeat trop are neg.  [CS]  0405 Discussed results with patient. No signs of ACS or PE. No other concerns for life threatening cause of her symptoms. Recommend she rest at home, Motrin for pain and PCP follow up. RTED if symptoms worsen or she has any other concerns.  [CS]    Clinical Course User Index [CS] Pollyann Savoy, MD     MDM  Rules/Calculators/A&P Medical Decision Making Given presenting complaint, I considered that admission might be necessary. After review of results from ED lab and/or imaging studies, admission to the hospital is not indicated at this time.    Problems Addressed: Atypical chest pain: acute illness or injury  Amount and/or Complexity of Data Reviewed Labs: ordered. Decision-making details documented in ED Course. Radiology: ordered and independent interpretation performed. Decision-making details documented in ED Course. ECG/medicine tests: ordered and independent interpretation performed. Decision-making details documented in ED Course.  Risk Decision regarding hospitalization.    Final Clinical Impression(s) / ED Diagnoses Final diagnoses:  Atypical chest pain    Rx / DC Orders ED Discharge Orders     None        Pollyann Savoy, MD 04/27/22 [phone]

## 2022-04-27 NOTE — ED Notes (Signed)
Assumed care, pt was up for discharge before RN was in the room.  Pt is alert and oriented, no change in condition since triage.  Pt states pain is still a 8/10, mostly when she coughs.  Pt was completely dressed and did not want to wait for discharge vitals.
